# Patient Record
Sex: Female | Born: 1940
Health system: Southern US, Community
[De-identification: ages and names within clinical notes are randomized; demographics above are authoritative.]

## PROBLEM LIST (undated history)

## (undated) DIAGNOSIS — K5790 Diverticulosis of intestine, part unspecified, without perforation or abscess without bleeding: Secondary | ICD-10-CM

## (undated) DIAGNOSIS — R7303 Prediabetes: Secondary | ICD-10-CM

## (undated) DIAGNOSIS — E7800 Pure hypercholesterolemia, unspecified: Secondary | ICD-10-CM

## (undated) DIAGNOSIS — K5792 Diverticulitis of intestine, part unspecified, without perforation or abscess without bleeding: Secondary | ICD-10-CM

## (undated) HISTORY — DX: Diverticulosis of intestine, part unspecified, without perforation or abscess without bleeding: K57.90

## (undated) HISTORY — DX: Prediabetes: R73.03

## (undated) HISTORY — PX: CHOLECYSTECTOMY: SHX55

## (undated) HISTORY — PX: EYE SURGERY: SHX253

## (undated) HISTORY — DX: Pure hypercholesterolemia, unspecified: E78.00

## (undated) HISTORY — PX: OTHER SURGICAL HISTORY: SHX169

---

## 1998-08-20 ENCOUNTER — Encounter: Payer: Self-pay | Admitting: Emergency Medicine

## 1998-08-20 ENCOUNTER — Emergency Department (HOSPITAL_COMMUNITY): Admission: EM | Admit: 1998-08-20 | Discharge: 1998-08-20 | Payer: Self-pay | Admitting: Emergency Medicine

## 1999-06-30 ENCOUNTER — Other Ambulatory Visit: Admission: RE | Admit: 1999-06-30 | Discharge: 1999-06-30 | Payer: Self-pay | Admitting: Obstetrics & Gynecology

## 2000-05-16 ENCOUNTER — Encounter: Payer: Self-pay | Admitting: Internal Medicine

## 2000-05-16 ENCOUNTER — Ambulatory Visit (HOSPITAL_COMMUNITY): Admission: RE | Admit: 2000-05-16 | Discharge: 2000-05-16 | Payer: Self-pay | Admitting: Internal Medicine

## 2001-04-15 ENCOUNTER — Ambulatory Visit (HOSPITAL_COMMUNITY): Admission: RE | Admit: 2001-04-15 | Discharge: 2001-04-15 | Payer: Self-pay | Admitting: Internal Medicine

## 2001-04-15 ENCOUNTER — Encounter: Payer: Self-pay | Admitting: Internal Medicine

## 2002-06-15 ENCOUNTER — Ambulatory Visit (HOSPITAL_COMMUNITY): Admission: RE | Admit: 2002-06-15 | Discharge: 2002-06-15 | Payer: Self-pay | Admitting: Internal Medicine

## 2002-06-15 ENCOUNTER — Encounter: Payer: Self-pay | Admitting: Internal Medicine

## 2002-07-11 ENCOUNTER — Encounter: Payer: Self-pay | Admitting: Internal Medicine

## 2002-07-11 ENCOUNTER — Ambulatory Visit (HOSPITAL_COMMUNITY): Admission: RE | Admit: 2002-07-11 | Discharge: 2002-07-11 | Payer: Self-pay | Admitting: Internal Medicine

## 2003-09-04 ENCOUNTER — Ambulatory Visit (HOSPITAL_COMMUNITY): Admission: RE | Admit: 2003-09-04 | Discharge: 2003-09-04 | Payer: Self-pay | Admitting: Internal Medicine

## 2003-10-02 ENCOUNTER — Ambulatory Visit (HOSPITAL_COMMUNITY): Admission: RE | Admit: 2003-10-02 | Discharge: 2003-10-02 | Payer: Self-pay | Admitting: Internal Medicine

## 2003-10-08 ENCOUNTER — Ambulatory Visit (HOSPITAL_COMMUNITY): Admission: RE | Admit: 2003-10-08 | Discharge: 2003-10-08 | Payer: Self-pay | Admitting: General Surgery

## 2004-03-05 ENCOUNTER — Ambulatory Visit (HOSPITAL_COMMUNITY): Admission: RE | Admit: 2004-03-05 | Discharge: 2004-03-05 | Payer: Self-pay | Admitting: Internal Medicine

## 2004-09-25 ENCOUNTER — Ambulatory Visit (HOSPITAL_COMMUNITY): Admission: RE | Admit: 2004-09-25 | Discharge: 2004-09-25 | Payer: Self-pay | Admitting: Internal Medicine

## 2004-09-29 ENCOUNTER — Ambulatory Visit (HOSPITAL_COMMUNITY): Admission: RE | Admit: 2004-09-29 | Discharge: 2004-09-29 | Payer: Self-pay | Admitting: Internal Medicine

## 2005-02-02 ENCOUNTER — Ambulatory Visit (HOSPITAL_COMMUNITY): Admission: RE | Admit: 2005-02-02 | Discharge: 2005-02-02 | Payer: Self-pay | Admitting: Internal Medicine

## 2005-02-11 ENCOUNTER — Ambulatory Visit (HOSPITAL_COMMUNITY): Admission: RE | Admit: 2005-02-11 | Discharge: 2005-02-11 | Payer: Self-pay | Admitting: Internal Medicine

## 2006-03-15 ENCOUNTER — Ambulatory Visit: Payer: Self-pay | Admitting: Internal Medicine

## 2006-04-28 ENCOUNTER — Ambulatory Visit: Payer: Self-pay | Admitting: Internal Medicine

## 2006-05-25 ENCOUNTER — Ambulatory Visit (HOSPITAL_COMMUNITY): Admission: RE | Admit: 2006-05-25 | Discharge: 2006-05-25 | Payer: Self-pay | Admitting: Internal Medicine

## 2006-11-18 ENCOUNTER — Ambulatory Visit (HOSPITAL_COMMUNITY): Admission: RE | Admit: 2006-11-18 | Discharge: 2006-11-18 | Payer: Self-pay | Admitting: Internal Medicine

## 2007-11-24 ENCOUNTER — Ambulatory Visit (HOSPITAL_COMMUNITY): Admission: RE | Admit: 2007-11-24 | Discharge: 2007-11-24 | Payer: Self-pay | Admitting: Internal Medicine

## 2009-06-06 ENCOUNTER — Ambulatory Visit (HOSPITAL_COMMUNITY): Admission: RE | Admit: 2009-06-06 | Discharge: 2009-06-06 | Payer: Self-pay | Admitting: Internal Medicine

## 2010-02-09 ENCOUNTER — Encounter (INDEPENDENT_AMBULATORY_CARE_PROVIDER_SITE_OTHER): Payer: Self-pay | Admitting: Internal Medicine

## 2010-04-02 ENCOUNTER — Encounter (INDEPENDENT_AMBULATORY_CARE_PROVIDER_SITE_OTHER): Payer: Self-pay | Admitting: Internal Medicine

## 2010-04-02 ENCOUNTER — Ambulatory Visit (HOSPITAL_COMMUNITY): Admission: RE | Admit: 2010-04-02 | Payer: Self-pay | Source: Ambulatory Visit | Admitting: Internal Medicine

## 2010-05-22 ENCOUNTER — Encounter (HOSPITAL_BASED_OUTPATIENT_CLINIC_OR_DEPARTMENT_OTHER): Payer: Medicare Other | Admitting: Internal Medicine

## 2010-05-22 ENCOUNTER — Ambulatory Visit (HOSPITAL_COMMUNITY)
Admission: RE | Admit: 2010-05-22 | Discharge: 2010-05-22 | Disposition: A | Discharge status: Home / Self Care | Payer: Medicare Other | Source: Ambulatory Visit | Attending: Internal Medicine | Admitting: Internal Medicine

## 2010-05-22 DIAGNOSIS — Z800 Family history of malignant neoplasm of digestive organs: Secondary | ICD-10-CM

## 2010-05-22 DIAGNOSIS — K5730 Diverticulosis of large intestine without perforation or abscess without bleeding: Secondary | ICD-10-CM | POA: Insufficient documentation

## 2010-05-22 DIAGNOSIS — Z860100 Personal history of colon polyps, unspecified: Secondary | ICD-10-CM | POA: Insufficient documentation

## 2010-05-22 DIAGNOSIS — Q2733 Arteriovenous malformation of digestive system vessel: Secondary | ICD-10-CM | POA: Insufficient documentation

## 2010-05-22 DIAGNOSIS — Z09 Encounter for follow-up examination after completed treatment for conditions other than malignant neoplasm: Secondary | ICD-10-CM | POA: Insufficient documentation

## 2010-06-06 NOTE — H&P (Signed)
NAME:  Amy Rojas, Amy Rojas                         ACCOUNT NO.:  [id number]   MEDICAL RECORD NO.:  [id number]                   PATIENT TYPE:  OUT   LOCATION:  RAD                                  FACILITY:  [facility]   PHYSICIAN:  Dalia Heading, M.D.               DATE OF BIRTH:  02-May-1940   DATE OF ADMISSION:  09/04/2003  DATE OF DISCHARGE:  09/04/2003                                HISTORY & PHYSICAL   CHIEF COMPLAINT:  1. Biliary colic.  2. Cholelithiasis.   HISTORY OF PRESENT ILLNESS:  The patient is a 70 year old white female who  is referred for evaluation and treatment of biliary colic secondary to  cholelithiasis.  She has been having intermittent episodes of right upper  quadrant abdominal pain with radiation to the right flank, nausea and  bloating for several months.  She does have fatty food intolerance.  The  symptoms have persisted.  No fever, chills or  jaundice have been noted.   PAST MEDICAL HISTORY:  Hypertension.   PAST SURGICAL HISTORY:  Bilateral cataract surgery.   CURRENT MEDICATIONS:  1. Tricor one tablet p.o. daily.  2. Caltrate supplements.  3. Multivitamin supplement.  4. Fiber supplement.   ALLERGIES:  No known drug allergies.   REVIEW OF SYSTEMS:  Noncontributory.   PHYSICAL EXAMINATION:  GENERAL APPEARANCE:  The patient is a well-developed,  well-nourished, white female in no acute distress.  VITAL SIGNS:  She is afebrile and vital signs are stable.  HEENT:  No scleral icterus.  LUNGS:  Clear to auscultation with equal breath sounds bilaterally.  HEART:  Regular rate and rhythm without S3, S4 or murmurs.  ABDOMEN:  Soft with slight tenderness down the right upper quadrant to  palpation.  No hepatosplenomegaly, masses or hernias are identified.  Ultrasound of the gallbladder reveals cholelithiasis with a normal common  bile duct.   IMPRESSION:  Cholecystitis and cholelithiasis.   PLAN:  The patient is scheduled for laparoscopic cholecystectomy  on  October 05, 2003.  The risks and benefits of the procedure, including  bleeding, infection, hepatobiliary injury and the possibility of an open  procedure were fully explained to the patient, who gave informed consent.     ___________________________________________                                         Dalia Heading, M.D.   MAJ/MEDQ  D:  09/11/2003  T:  09/11/2003  Job:  [id number]   cc:   Kingsley Callander. Ouida Sills, M.D.  [address]  [place]  [place] [address]  Fax: [phone]

## 2010-06-06 NOTE — Op Note (Signed)
NAME:  Amy Rojas, Amy Rojas                         ACCOUNT NO.:  [id number]   MEDICAL RECORD NO.:  [id number]                   PATIENT TYPE:  AMB   LOCATION:  DAY                                  FACILITY:  [facility]   PHYSICIAN:  Lionel December, M.D.                 DATE OF BIRTH:  14-Nov-1940   DATE OF PROCEDURE:  10/02/2003  DATE OF DISCHARGE:                                 OPERATIVE REPORT   PROCEDURE:  Total colonoscopy.   INDICATIONS:  Amy Rojas is a 70 year old Caucasian female who is here for  screening exam. She does not have any GI symptoms. Family history is  significant for colon carcinoma in maternal uncle who died of it at age 63.  Two of his sons are the patient's first cousins also had colon carcinoma in  their 99s or early 45s.  She is therefore felt to be high risk and therefore  undergoing procedure at 5 year intervals rather than 10. Procedure and risks  were reviewed with the patient and informed consent was obtained.   PREOPERATIVE MEDICATIONS:  Demerol 25 mg IV, Versed 3 mg IV in divided  doses.   FINDINGS:  Procedure performed in endoscopy suite. The patient's vital signs  and O2 saturations were monitored during procedure and remained stable. The  patient was placed in the left lateral position and rectal examination  performed. No abnormality noted on external or digital exam. Olympus video  scope was placed in the rectum and advanced into sigmoid colon and beyond.  Preparation was satisfactory. She had a few scattered diverticula at the  sigmoid colon. Somewhat redundant hepatic flexure. Using different positions  and abdominal pressure, I was able to advance the scope into the cecum which  was identified by appendiceal orifice and ileocecal valve. Picture taken for  the record. As the scope was withdrawn, colonic mucosa was once again  carefully examined, and there were no mucosal abnormalities. The rectal  mucosa similarly was normal. Scope was retroflexed to examine  anorectal  junction, and small hemorrhoids were noted below the dentate line. The scope  was straightened and withdrawn. The patient tolerated the procedure well.   FINAL DIAGNOSIS:  Few small diverticula at sigmoid colon and external  hemorrhoids. Otherwise normal examination.   RECOMMENDATIONS:  1.  High-fiber diet.  2.  She will continue early Hemoccults and may consider next screening in      five years. If the next exam is also normal, she could increase interval      to every 10 years.      ___________________________________________                                            Lionel December, M.D.   NR/MEDQ  D:  10/02/2003  T:  10/02/2003  Job:  [id number]

## 2010-06-06 NOTE — Op Note (Signed)
NAME:  Amy Rojas, Amy Rojas                         ACCOUNT NO.:  [id number]   MEDICAL RECORD NO.:  [id number]                   PATIENT TYPE:  AMB   LOCATION:  DAY                                  FACILITY:  [facility]   PHYSICIAN:  Dalia Heading, M.D.               DATE OF BIRTH:  07-22-1940   DATE OF PROCEDURE:  10/08/2003  DATE OF DISCHARGE:                                 OPERATIVE REPORT   PREOPERATIVE DIAGNOSIS:  Cholecystitis, cholelithiasis.   POSTOPERATIVE DIAGNOSIS:  Cholecystitis, cholelithiasis.   PROCEDURE:  Laparoscopic cholecystectomy.   SURGEON:  Dalia Heading, M.D.   ASSISTANT:  Bernerd Limbo. Leona Carry, M.D.   ANESTHESIA:  General endotracheal.   INDICATIONS FOR PROCEDURE:  The patient is a 70 year old white female who  presents with cholecystitis secondary to cholelithiasis.  The risks and  benefits of the procedure, including bleeding, infection, hepatobiliary  injury, and the possibility of an open procedure were fully explained to the  patient who gave informed consent.   DESCRIPTION OF PROCEDURE:  The patient was placed in the supine position.  After induction of general endotracheal anesthesia, the abdomen was prepped  and draped using the usual sterile technique with Betadine.  Surgical site  confirmation was performed.   An infraumbilical incision was made down to the fascia.  A Veress needle was  introduced into the abdominal cavity, and confirmation of placement was done  using the saline drop test.  The abdomen was then insufflated to 16 mmHg  pressure.  An 11-mm trocar was introduced into the abdominal cavity under  direct visualization without difficulty.  The patient was placed in reverse  Trendelenburg position, and an additional 11-mm trocar was placed in the  epigastric region and 5-mm trocars were placed in the right upper quadrant  and right flank regions.  The liver was inspected and noted to be within  normal limits.  The gallbladder was retracted  superiorly and laterally.  The  dissection was begun around the infundibulum of the gallbladder.  The cystic  duct was first identified.  Its juncture to the infundibulum was fully  identified.  Endoclips were placed proximally and distally on the cystic  duct, and the cystic duct was divided.  This was likewise done on the cystic  artery.  The gallbladder was then freed away from the gallbladder fossa  using Bovie electrocautery.  The gallbladder was delivered through the  epigastric trocar site using an EndoCatch bag.  The gallbladder fossa was  inspected, and no abnormal bleeding or bile leakage was noted.  Surgicel was  placed in the gallbladder fossa.  All fluid and air were then evacuated from  the abdominal cavity prior to removal of the trocars.   All wounds were irrigated with normal saline.  All wounds were injected with  0.5% Sensorcaine.  The infraumbilical fascia was reapproximated using an 0  Vicryl interrupted suture.  All skin incisions  were closed using staples.  Betadine ointment and dry sterile dressings were applied.   All tape and needle counts were correct at the end of the procedure.  The  patient was extubated in the operating room and went back to the recovery  room awake and in stable condition.   COMPLICATIONS:  None.   SPECIMENS:  Gallbladder with stones.   ESTIMATED BLOOD LOSS:  Minimal.      MAJ/MEDQ  D:  10/08/2003  T:  10/08/2003  Job:  [id number]   cc:   Kingsley Callander. Ouida Sills, M.D.  [address]  [place]  [place] [address]  Fax: [phone]

## 2010-06-10 NOTE — Op Note (Signed)
  Amy Rojas, Amy Rojas               ACCOUNT NO.:  [id number]  MEDICAL RECORD NO.:  [id number]           PATIENT TYPE:  O  LOCATION:  DAYP                          FACILITY:  [facility]  PHYSICIAN:  Lionel December, M.D.    DATE OF BIRTH:  August 22, 1940  DATE OF PROCEDURE:  05/22/2010 DATE OF DISCHARGE:                              OPERATIVE REPORT   PROCEDURE:  Colonoscopy.  INDICATIONS:  [name] is a 70 year old Caucasian female with history of colonic polyps as well as positive family history of colon carcinoma. She lost her maternal uncle at age 10 of colon carcinoma and 2 first cousins of colon carcinoma in their 25s.  Her last exam in September 2005, however, was negative for colonic polyps.  She had polyps on prior exam.  Procedure risks were reviewed with the patient and informed consent was obtained.  MEDS FOR CONSCIOUS SEDATION: 1. Demerol 50 mg IV. 2. Versed 4 mg IV.  FINDINGS:  Procedure performed in endoscopy suite.  The patient's vital signs and O2 sat were monitored during the procedure and remained stable.  The patient was placed in left lateral recumbent position and rectal examination was performed.  No abnormality noted on external or digital exam.  Pentax videoscope was placed through rectum and advanced under vision into sigmoid colon where she had pieces of formed stool. Multiple diverticula were noted at sigmoid colon.  Some were quite large.  Scope was carefully passed through this area into descending colon and beyond.  Preparation above was satisfactory.  Scope was passed to cecum which was identified by appendiceal orifice and ileocecal valve.  There was a single AV malformation over the ileocecal valve without stigmata of bleeding.  Therefore this was left alone.  As the scope was withdrawn, rest of the colonic mucosa was carefully examined and no polyps and/or tumor masses were noted.  Rectal mucosa similarly was normal.  Scope was retroflexed to examine  anorectal junction and small hemorrhoids noted below the dentate line.  Endoscope was withdrawn.  Withdrawal time was 8 minutes.  The patient tolerated the procedure well.  FINAL DIAGNOSES: 1. Examination performed to cecum. 2. Single arteriovenous malformation over ileocecal valve without     stigmata of bleeding, was left alone. 3. Multiple diverticula at the sigmoid colon and small external     hemorrhoids.  RECOMMENDATIONS: 1. She will continue with high-fiber diet and her usual meds including     Colace p.r.n. 2. Should consider next exam in 5 years.          ______________________________ Lionel December, M.D.     NR/MEDQ  D:  05/22/2010  T:  05/22/2010  Job:  [id number]  cc:   Kingsley Callander. Ouida Sills, MD Fax: [phone]  Electronically Signed by Lionel December M.D. on 06/10/2010 12:19:18 PM

## 2010-10-01 ENCOUNTER — Other Ambulatory Visit (HOSPITAL_COMMUNITY): Payer: Self-pay | Admitting: Internal Medicine

## 2010-10-01 ENCOUNTER — Ambulatory Visit (HOSPITAL_COMMUNITY)
Admission: RE | Admit: 2010-10-01 | Discharge: 2010-10-01 | Disposition: A | Discharge status: Home / Self Care | Payer: Medicare Other | Source: Ambulatory Visit | Attending: Internal Medicine | Admitting: Internal Medicine

## 2010-10-01 DIAGNOSIS — M546 Pain in thoracic spine: Secondary | ICD-10-CM

## 2010-10-01 DIAGNOSIS — IMO0002 Reserved for concepts with insufficient information to code with codable children: Secondary | ICD-10-CM | POA: Insufficient documentation

## 2011-05-19 ENCOUNTER — Other Ambulatory Visit (HOSPITAL_COMMUNITY): Payer: Self-pay | Admitting: Internal Medicine

## 2011-05-19 DIAGNOSIS — Z139 Encounter for screening, unspecified: Secondary | ICD-10-CM

## 2011-05-26 ENCOUNTER — Ambulatory Visit (HOSPITAL_COMMUNITY)
Admission: RE | Admit: 2011-05-26 | Discharge: 2011-05-26 | Disposition: A | Discharge status: Home / Self Care | Payer: Medicare Other | Source: Ambulatory Visit | Attending: Internal Medicine | Admitting: Internal Medicine

## 2011-05-26 DIAGNOSIS — Z1231 Encounter for screening mammogram for malignant neoplasm of breast: Secondary | ICD-10-CM | POA: Insufficient documentation

## 2011-05-26 DIAGNOSIS — Z139 Encounter for screening, unspecified: Secondary | ICD-10-CM

## 2011-12-22 ENCOUNTER — Ambulatory Visit (HOSPITAL_COMMUNITY)
Admission: RE | Admit: 2011-12-22 | Discharge: 2011-12-22 | Disposition: A | Discharge status: Home / Self Care | Payer: Medicare Other | Source: Ambulatory Visit | Attending: Internal Medicine | Admitting: Internal Medicine

## 2011-12-22 ENCOUNTER — Other Ambulatory Visit (HOSPITAL_COMMUNITY): Payer: Self-pay | Admitting: Internal Medicine

## 2011-12-22 DIAGNOSIS — T148XXA Other injury of unspecified body region, initial encounter: Secondary | ICD-10-CM

## 2011-12-22 DIAGNOSIS — S6990XA Unspecified injury of unspecified wrist, hand and finger(s), initial encounter: Secondary | ICD-10-CM | POA: Insufficient documentation

## 2011-12-22 DIAGNOSIS — S6980XA Other specified injuries of unspecified wrist, hand and finger(s), initial encounter: Secondary | ICD-10-CM | POA: Insufficient documentation

## 2011-12-22 DIAGNOSIS — X58XXXA Exposure to other specified factors, initial encounter: Secondary | ICD-10-CM | POA: Insufficient documentation

## 2011-12-24 ENCOUNTER — Ambulatory Visit (INDEPENDENT_AMBULATORY_CARE_PROVIDER_SITE_OTHER): Payer: Medicare Other | Admitting: Orthopedic Surgery

## 2011-12-24 ENCOUNTER — Encounter: Payer: Self-pay | Admitting: Orthopedic Surgery

## 2011-12-24 DIAGNOSIS — M20011 Mallet finger of right finger(s): Secondary | ICD-10-CM

## 2011-12-24 DIAGNOSIS — M20019 Mallet finger of unspecified finger(s): Secondary | ICD-10-CM

## 2011-12-24 NOTE — Progress Notes (Signed)
Patient ID: Amy Rojas, female   DOB: 17-Mar-1940, 70 y.o.   MRN: [id number] Chief Complaint  Patient presents with  . Hand Injury    Small finger of right hand, DOI 12-18-11.    The patient is a pain over the right small finger after hitting it against a shelf putting something back at [facility] home improvement. ER results showed negative x-ray.  Systems she complains of double vision and watering of her eyes  Exam normal appearance, normal orientation. She has a normal mood and affect. On inspection she is a flexion deformity of the right small finger with normal passive motion at that joint it is otherwise stable with weak extension skin is intact good color normal sensation  X-ray negative  Impression soft tissue mallet  Recommend hand and rehabilitation extension protocol follow up in 6 weeks

## 2011-12-24 NOTE — Patient Instructions (Signed)
REFERRAL TO [facility] FOR MALLET FINGER PROTOCOL

## 2012-02-03 ENCOUNTER — Telehealth: Payer: Self-pay | Admitting: Orthopedic Surgery

## 2012-02-03 NOTE — Telephone Encounter (Signed)
i called patient and relayed per dr Romeo Apple to keep appointment tomorrow; she states she cannot come - said she is very busy right now.   encouraged her to keep appointment and offered alternate time.  She would not re-schedule; states she will have to call back tomorrow to re-schedule.

## 2012-02-03 NOTE — Telephone Encounter (Signed)
Keep appointment here

## 2012-02-03 NOTE — Telephone Encounter (Signed)
Patient called back following our call to confirm her appointment for tomorrow, fol/up for mallet finger.  Patient states she never received a call from [facility], [place], to schedule for splint and therapy, although she had not called to re-check with our office..  I personally was not here that day but I explained to patient that typically we give paperwork to patient with the contact phone # of therapy to call to schedule the appointment according to best possible time for patient - and that we fax the order to therapy provider. (I called this office to verify if they have an order - reached voice mail; left message)  Patient states she feels she should cancel the appointment due to no therapy yet.  She also said she made a splint of her own out of popsicle sticks, and if still needs to go to therapy, would prefer to not schedule with [facility].  Please advise - keep appointment for tomorrow, 1/16 to re-evaluate and to make arrangements for therapy if still required?  PT ph [phone]

## 2012-02-04 ENCOUNTER — Ambulatory Visit: Payer: Medicare Other | Admitting: Orthopedic Surgery

## 2012-02-18 ENCOUNTER — Ambulatory Visit: Payer: Medicare Other | Admitting: Orthopedic Surgery

## 2012-03-01 ENCOUNTER — Encounter: Payer: Self-pay | Admitting: Orthopedic Surgery

## 2012-03-01 ENCOUNTER — Ambulatory Visit (INDEPENDENT_AMBULATORY_CARE_PROVIDER_SITE_OTHER): Payer: Medicare Other | Admitting: Orthopedic Surgery

## 2012-03-01 VITALS — BP 122/62 | Ht 64.5 in | Wt 131.0 lb

## 2012-03-01 DIAGNOSIS — M20011 Mallet finger of right finger(s): Secondary | ICD-10-CM

## 2012-03-01 DIAGNOSIS — M20019 Mallet finger of unspecified finger(s): Secondary | ICD-10-CM

## 2012-03-01 NOTE — Patient Instructions (Signed)
activities as tolerated 

## 2012-03-01 NOTE — Progress Notes (Signed)
Patient ID: Amy Rojas, female   DOB: 1940/02/06, 72 y.o.   MRN: [id number] Chief Complaint  Patient presents with  . Follow-up    6 week recheck on right small mallet finger, did not go to therapy.    RSF DIP joint Mallet finger   Residual flexion deformity  Can make the full fist   Ok with residual deficit with extension   Follow up prn

## 2012-03-24 ENCOUNTER — Other Ambulatory Visit (HOSPITAL_COMMUNITY): Payer: Self-pay | Admitting: Internal Medicine

## 2012-03-24 ENCOUNTER — Ambulatory Visit (HOSPITAL_COMMUNITY)
Admission: RE | Admit: 2012-03-24 | Discharge: 2012-03-24 | Disposition: A | Discharge status: Home / Self Care | Payer: Medicare Other | Source: Ambulatory Visit | Attending: Internal Medicine | Admitting: Internal Medicine

## 2012-03-24 DIAGNOSIS — Z139 Encounter for screening, unspecified: Secondary | ICD-10-CM

## 2012-03-24 DIAGNOSIS — R079 Chest pain, unspecified: Secondary | ICD-10-CM

## 2012-03-24 DIAGNOSIS — R0789 Other chest pain: Secondary | ICD-10-CM | POA: Insufficient documentation

## 2013-05-12 DIAGNOSIS — E785 Hyperlipidemia, unspecified: Secondary | ICD-10-CM | POA: Insufficient documentation

## 2013-05-12 DIAGNOSIS — E119 Type 2 diabetes mellitus without complications: Secondary | ICD-10-CM | POA: Insufficient documentation

## 2013-05-12 DIAGNOSIS — R809 Proteinuria, unspecified: Secondary | ICD-10-CM | POA: Insufficient documentation

## 2013-05-12 DIAGNOSIS — N19 Unspecified kidney failure: Secondary | ICD-10-CM | POA: Insufficient documentation

## 2013-05-15 ENCOUNTER — Encounter: Payer: Self-pay | Admitting: Cardiology

## 2013-05-15 ENCOUNTER — Ambulatory Visit (INDEPENDENT_AMBULATORY_CARE_PROVIDER_SITE_OTHER): Payer: Medicare Other | Admitting: Cardiology

## 2013-05-15 VITALS — BP 130/88 | HR 64 | Ht 66.0 in | Wt 134.0 lb

## 2013-05-15 DIAGNOSIS — R079 Chest pain, unspecified: Secondary | ICD-10-CM

## 2013-05-15 DIAGNOSIS — I447 Left bundle-branch block, unspecified: Secondary | ICD-10-CM

## 2013-05-15 NOTE — Patient Instructions (Signed)
Your physician recommends that you schedule a follow-up appointment to be determined after the echo.We will call you with results  Your physician has requested that you have an echocardiogram. Echocardiography is a painless test that uses sound waves to create images of your heart. It provides your doctor with information about the size and shape of your heart and how well your heart's chambers and valves are working. This procedure takes approximately one hour. There are no restrictions for this procedure.     Thank you for choosing [facility] !

## 2013-05-15 NOTE — Progress Notes (Signed)
Clinical Summary Amy Rojas is a 73 y.o.female seen today as a new patient, she is referred for chest pain  1. CAD - walks regularly, states that when walks up incline feeling of pressure in midchest 3/10, with SOB, not diaphoretic, no nausea. Resolves once get back on level ground. Symptoms started 2 months ago. Can vary in frequnecy, stable in level of exertion of onset. - No LE edema, no orthopnea, no PND - walks one mile, typically over 20 minutes  - outpatient EKG by her PCP shows LBBB, unknown duration - CAD risk factors: DM, HL,hypertriglyceridemia, age. Brother CABG 73s.   Past Medical History  Diagnosis Date  . High cholesterol   . Diverticulosis   . Borderline diabetic      Allergies  Allergen Reactions  . Sulfa Antibiotics      Current Outpatient Prescriptions  Medication Sig Dispense Refill  . aspirin 81 MG tablet Take 81 mg by mouth daily.      Mariane Baumgarten Sodium (COLACE PO) Take by mouth.      . fenofibrate 160 MG tablet Take 160 mg by mouth daily.       . nitrofurantoin, macrocrystal-monohydrate, (MACROBID) 100 MG capsule Take 100 mg by mouth 2 (two) times daily.      . Psyllium (METAMUCIL PO) Take by mouth.       No current facility-administered medications for this visit.     Past Surgical History  Procedure Laterality Date  . High cholesterol    . Cataracts    . Cholecystectomy    . Eye surgery       Allergies  Allergen Reactions  . Sulfa Antibiotics       Family History  Problem Relation Age of Onset  . Arthritis    . Diabetes    . Kidney disease       Social History Ms. Cullars reports that she has never smoked. She does not have any smokeless tobacco history on file. Ms. Mcdonald reports that she does not drink alcohol.   Review of Systems CONSTITUTIONAL: No weight loss, fever, chills, weakness or fatigue.  HEENT: Eyes: No visual loss, blurred vision, double vision or yellow sclerae.No hearing loss, sneezing,  congestion, runny nose or sore throat.  SKIN: No rash or itching.  CARDIOVASCULAR: per HPI RESPIRATORY: No shortness of breath, cough or sputum.  GASTROINTESTINAL: No anorexia, nausea, vomiting or diarrhea. No abdominal pain or blood.  GENITOURINARY: No burning on urination, no polyuria NEUROLOGICAL: No headache, dizziness, syncope, paralysis, ataxia, numbness or tingling in the extremities. No change in bowel or bladder control.  MUSCULOSKELETAL: No muscle, back pain, joint pain or stiffness.  LYMPHATICS: No enlarged nodes. No history of splenectomy.  PSYCHIATRIC: No history of depression or anxiety.  ENDOCRINOLOGIC: No reports of sweating, cold or heat intolerance. No polyuria or polydipsia.  Marland Kitchen   Physical Examination p 64 bp 130/88 Wt 134 lbs BMI 22 Gen: resting comfortably, no acute distress HEENT: no scleral icterus, pupils equal round and reactive, no palptable cervical adenopathy,  CV: RRR, no m/r/g, no JVD, no carotid bruits Resp: Clear to auscultation bilaterally GI: abdomen is soft, non-tender, non-distended, normal bowel sounds, no hepatosplenomegaly MSK: extremities are warm, no edema.  Skin: warm, no rash Neuro:  no focal deficits Psych: appropriate affect   EKG NSR, LBBB  Assessment and Plan  1. Chest pain - symptoms suggestive of possible exertional angina - she has several CAD risk factors for CAD and a LBBB which may  support underlying structural heart disease - at this point will obtain an echo to evaluate LV systolic function. If normal LVEF proceed with Lexiscan, if decreased LVEF in the setting of LBBB and exertional chest pain would pursue invasive testing.      Arnoldo Lenis, M.D., F.A.C.C.

## 2013-05-16 ENCOUNTER — Other Ambulatory Visit: Payer: Self-pay

## 2013-05-16 ENCOUNTER — Ambulatory Visit (HOSPITAL_COMMUNITY)
Admission: RE | Admit: 2013-05-16 | Discharge: 2013-05-16 | Disposition: A | Discharge status: Home / Self Care | Payer: Medicare Other | Source: Ambulatory Visit | Attending: Cardiology | Admitting: Cardiology

## 2013-05-16 ENCOUNTER — Encounter: Payer: Self-pay | Admitting: Cardiology

## 2013-05-16 DIAGNOSIS — R079 Chest pain, unspecified: Secondary | ICD-10-CM

## 2013-05-16 DIAGNOSIS — E785 Hyperlipidemia, unspecified: Secondary | ICD-10-CM | POA: Insufficient documentation

## 2013-05-16 DIAGNOSIS — I2510 Atherosclerotic heart disease of native coronary artery without angina pectoris: Secondary | ICD-10-CM | POA: Insufficient documentation

## 2013-05-16 DIAGNOSIS — I517 Cardiomegaly: Secondary | ICD-10-CM

## 2013-05-16 DIAGNOSIS — E119 Type 2 diabetes mellitus without complications: Secondary | ICD-10-CM | POA: Insufficient documentation

## 2013-05-16 NOTE — Progress Notes (Signed)
  Echocardiogram 2D Echocardiogram has been performed.  Miyuki Rzasa L Levada Bowersox 05/16/2013, 10:01 AM

## 2013-05-18 ENCOUNTER — Telehealth: Payer: Self-pay | Admitting: Cardiology

## 2013-05-18 ENCOUNTER — Encounter: Payer: Self-pay | Admitting: Cardiology

## 2013-05-18 ENCOUNTER — Encounter: Payer: Self-pay | Admitting: *Deleted

## 2013-05-18 NOTE — Telephone Encounter (Signed)
Patient would like to speak with nurse regarding questions about lexiscan myoview / tgs

## 2013-05-18 NOTE — Telephone Encounter (Signed)
Made pt aware about Lexiscan , also sendng information and instructions in the mail.

## 2013-05-26 ENCOUNTER — Encounter (HOSPITAL_COMMUNITY)
Admission: RE | Admit: 2013-05-26 | Discharge: 2013-05-26 | Disposition: A | Discharge status: Home / Self Care | Payer: Medicare Other | Source: Ambulatory Visit | Attending: Cardiology | Admitting: Cardiology

## 2013-05-26 ENCOUNTER — Encounter (HOSPITAL_COMMUNITY): Payer: Self-pay

## 2013-05-26 DIAGNOSIS — R079 Chest pain, unspecified: Secondary | ICD-10-CM | POA: Insufficient documentation

## 2013-05-26 DIAGNOSIS — I447 Left bundle-branch block, unspecified: Secondary | ICD-10-CM | POA: Insufficient documentation

## 2013-05-26 DIAGNOSIS — E785 Hyperlipidemia, unspecified: Secondary | ICD-10-CM | POA: Insufficient documentation

## 2013-05-26 MED ORDER — TECHNETIUM TC 99M SESTAMIBI GENERIC - CARDIOLITE
30.0000 | Freq: Once | INTRAVENOUS | Status: AC | PRN
  Administered 2013-05-26: 30 via INTRAVENOUS

## 2013-05-26 MED ORDER — TECHNETIUM TC 99M SESTAMIBI - CARDIOLITE
10.0000 | Freq: Once | INTRAVENOUS | Status: AC | PRN
  Administered 2013-05-26: 09:00:00 10 via INTRAVENOUS

## 2013-05-26 MED ORDER — REGADENOSON 0.4 MG/5ML IV SOLN
INTRAVENOUS | Status: AC
  Administered 2013-05-26: 0.4 mg via INTRAVENOUS
  Filled 2013-05-26: qty 5

## 2013-05-26 MED ORDER — SODIUM CHLORIDE 0.9 % IJ SOLN
INTRAMUSCULAR | Status: AC
  Administered 2013-05-26: 10 mL via INTRAVENOUS
  Filled 2013-05-26: qty 10

## 2013-05-26 NOTE — Progress Notes (Signed)
St[facility] Lab Nurses Notes - Forestine Na  CHAUNTAY PASZKIEWICZ 05/26/2013 Reason for doing test: Chest Pain and LBBB Type of test: Wille Glaser Nurse performing test: Gerrit Halls, RN Nuclear Medicine Tech: Melburn Hake Echo Tech: Not Applicable MD performing test: Myles Gip Family MD: Willey Blade Test explained and consent signed: yes IV started: 22g jelco, Saline lock flushed, No redness or edema and Saline lock started in radiology Symptoms: chest full & pressure Treatment/Intervention: None Reason test stopped: protocol completed After recovery IV was: Discontinued via X-ray tech and No redness or edema Patient to return to [place]. Med at : 12:00 Patient discharged: Home Patient's Condition upon discharge was: stable Comments: During test BP 158/60 & HR 100.  Recovery BP 151/55 & HR 82.  Symptoms resolved in recovery. Donnajean Lopes

## 2013-10-24 ENCOUNTER — Other Ambulatory Visit (HOSPITAL_COMMUNITY): Payer: Self-pay | Admitting: Internal Medicine

## 2013-10-24 DIAGNOSIS — Z139 Encounter for screening, unspecified: Secondary | ICD-10-CM

## 2013-10-26 ENCOUNTER — Ambulatory Visit (HOSPITAL_COMMUNITY)
Admission: RE | Admit: 2013-10-26 | Discharge: 2013-10-26 | Disposition: A | Discharge status: Home / Self Care | Payer: Medicare Other | Source: Ambulatory Visit | Attending: Internal Medicine | Admitting: Internal Medicine

## 2013-10-26 DIAGNOSIS — Z1231 Encounter for screening mammogram for malignant neoplasm of breast: Secondary | ICD-10-CM | POA: Insufficient documentation

## 2013-10-26 DIAGNOSIS — Z139 Encounter for screening, unspecified: Secondary | ICD-10-CM

## 2014-01-22 DIAGNOSIS — S63501S Unspecified sprain of right wrist, sequela: Secondary | ICD-10-CM | POA: Diagnosis not present

## 2014-01-22 DIAGNOSIS — Z23 Encounter for immunization: Secondary | ICD-10-CM | POA: Diagnosis not present

## 2014-01-29 DIAGNOSIS — E119 Type 2 diabetes mellitus without complications: Secondary | ICD-10-CM | POA: Diagnosis not present

## 2014-02-23 DIAGNOSIS — E119 Type 2 diabetes mellitus without complications: Secondary | ICD-10-CM | POA: Diagnosis not present

## 2014-02-23 DIAGNOSIS — Z79899 Other long term (current) drug therapy: Secondary | ICD-10-CM | POA: Diagnosis not present

## 2014-02-23 DIAGNOSIS — E785 Hyperlipidemia, unspecified: Secondary | ICD-10-CM | POA: Diagnosis not present

## 2014-02-28 DIAGNOSIS — H40003 Preglaucoma, unspecified, bilateral: Secondary | ICD-10-CM | POA: Diagnosis not present

## 2014-03-01 DIAGNOSIS — E785 Hyperlipidemia, unspecified: Secondary | ICD-10-CM | POA: Diagnosis not present

## 2014-03-01 DIAGNOSIS — E119 Type 2 diabetes mellitus without complications: Secondary | ICD-10-CM | POA: Diagnosis not present

## 2014-03-01 DIAGNOSIS — H9041 Sensorineural hearing loss, unilateral, right ear, with unrestricted hearing on the contralateral side: Secondary | ICD-10-CM | POA: Diagnosis not present

## 2014-03-16 DIAGNOSIS — J069 Acute upper respiratory infection, unspecified: Secondary | ICD-10-CM | POA: Diagnosis not present

## 2014-03-27 DIAGNOSIS — H40003 Preglaucoma, unspecified, bilateral: Secondary | ICD-10-CM | POA: Diagnosis not present

## 2014-04-23 DIAGNOSIS — H903 Sensorineural hearing loss, bilateral: Secondary | ICD-10-CM | POA: Diagnosis not present

## 2014-06-26 DIAGNOSIS — E119 Type 2 diabetes mellitus without complications: Secondary | ICD-10-CM | POA: Diagnosis not present

## 2014-07-03 DIAGNOSIS — E1129 Type 2 diabetes mellitus with other diabetic kidney complication: Secondary | ICD-10-CM | POA: Diagnosis not present

## 2014-09-21 DIAGNOSIS — N3000 Acute cystitis without hematuria: Secondary | ICD-10-CM | POA: Diagnosis not present

## 2014-09-21 DIAGNOSIS — Z6820 Body mass index (BMI) 20.0-20.9, adult: Secondary | ICD-10-CM | POA: Diagnosis not present

## 2014-09-21 DIAGNOSIS — N390 Urinary tract infection, site not specified: Secondary | ICD-10-CM | POA: Diagnosis not present

## 2014-09-27 DIAGNOSIS — H40003 Preglaucoma, unspecified, bilateral: Secondary | ICD-10-CM | POA: Diagnosis not present

## 2014-10-30 DIAGNOSIS — Z79899 Other long term (current) drug therapy: Secondary | ICD-10-CM | POA: Diagnosis not present

## 2014-10-30 DIAGNOSIS — E785 Hyperlipidemia, unspecified: Secondary | ICD-10-CM | POA: Diagnosis not present

## 2014-10-30 DIAGNOSIS — E119 Type 2 diabetes mellitus without complications: Secondary | ICD-10-CM | POA: Diagnosis not present

## 2014-11-05 DIAGNOSIS — E119 Type 2 diabetes mellitus without complications: Secondary | ICD-10-CM | POA: Diagnosis not present

## 2014-11-05 DIAGNOSIS — Z6820 Body mass index (BMI) 20.0-20.9, adult: Secondary | ICD-10-CM | POA: Diagnosis not present

## 2014-11-05 DIAGNOSIS — Z23 Encounter for immunization: Secondary | ICD-10-CM | POA: Diagnosis not present

## 2015-02-27 DIAGNOSIS — D239 Other benign neoplasm of skin, unspecified: Secondary | ICD-10-CM | POA: Diagnosis not present

## 2015-02-27 DIAGNOSIS — L570 Actinic keratosis: Secondary | ICD-10-CM | POA: Diagnosis not present

## 2015-03-06 DIAGNOSIS — E119 Type 2 diabetes mellitus without complications: Secondary | ICD-10-CM | POA: Diagnosis not present

## 2015-03-06 DIAGNOSIS — E785 Hyperlipidemia, unspecified: Secondary | ICD-10-CM | POA: Diagnosis not present

## 2015-03-06 DIAGNOSIS — Z79899 Other long term (current) drug therapy: Secondary | ICD-10-CM | POA: Diagnosis not present

## 2015-03-06 DIAGNOSIS — N19 Unspecified kidney failure: Secondary | ICD-10-CM | POA: Diagnosis not present

## 2015-03-12 DIAGNOSIS — E785 Hyperlipidemia, unspecified: Secondary | ICD-10-CM | POA: Diagnosis not present

## 2015-03-12 DIAGNOSIS — E119 Type 2 diabetes mellitus without complications: Secondary | ICD-10-CM | POA: Diagnosis not present

## 2015-03-12 DIAGNOSIS — Z6821 Body mass index (BMI) 21.0-21.9, adult: Secondary | ICD-10-CM | POA: Diagnosis not present

## 2015-03-12 DIAGNOSIS — Z23 Encounter for immunization: Secondary | ICD-10-CM | POA: Diagnosis not present

## 2015-03-27 DIAGNOSIS — H40003 Preglaucoma, unspecified, bilateral: Secondary | ICD-10-CM | POA: Diagnosis not present

## 2015-04-02 DIAGNOSIS — H40003 Preglaucoma, unspecified, bilateral: Secondary | ICD-10-CM | POA: Diagnosis not present

## 2015-06-04 ENCOUNTER — Encounter (INDEPENDENT_AMBULATORY_CARE_PROVIDER_SITE_OTHER): Payer: Self-pay | Admitting: *Deleted

## 2015-07-03 DIAGNOSIS — E119 Type 2 diabetes mellitus without complications: Secondary | ICD-10-CM | POA: Diagnosis not present

## 2015-07-11 ENCOUNTER — Other Ambulatory Visit (HOSPITAL_COMMUNITY): Payer: Self-pay | Admitting: Internal Medicine

## 2015-07-11 DIAGNOSIS — Z1231 Encounter for screening mammogram for malignant neoplasm of breast: Secondary | ICD-10-CM

## 2015-07-11 DIAGNOSIS — E119 Type 2 diabetes mellitus without complications: Secondary | ICD-10-CM | POA: Diagnosis not present

## 2015-07-15 ENCOUNTER — Ambulatory Visit (HOSPITAL_COMMUNITY)
Admission: RE | Admit: 2015-07-15 | Discharge: 2015-07-15 | Disposition: A | Discharge status: Home / Self Care | Payer: Medicare Other | Source: Ambulatory Visit | Attending: Internal Medicine | Admitting: Internal Medicine

## 2015-07-15 DIAGNOSIS — Z1231 Encounter for screening mammogram for malignant neoplasm of breast: Secondary | ICD-10-CM

## 2015-08-06 DIAGNOSIS — E119 Type 2 diabetes mellitus without complications: Secondary | ICD-10-CM | POA: Diagnosis not present

## 2015-08-29 DIAGNOSIS — H903 Sensorineural hearing loss, bilateral: Secondary | ICD-10-CM | POA: Diagnosis not present

## 2015-09-30 DIAGNOSIS — H40003 Preglaucoma, unspecified, bilateral: Secondary | ICD-10-CM | POA: Diagnosis not present

## 2015-10-28 DIAGNOSIS — S70362A Insect bite (nonvenomous), left thigh, initial encounter: Secondary | ICD-10-CM | POA: Diagnosis not present

## 2015-10-28 DIAGNOSIS — Z23 Encounter for immunization: Secondary | ICD-10-CM | POA: Diagnosis not present

## 2015-10-28 DIAGNOSIS — W57XXXA Bitten or stung by nonvenomous insect and other nonvenomous arthropods, initial encounter: Secondary | ICD-10-CM | POA: Diagnosis not present

## 2015-11-21 DIAGNOSIS — E119 Type 2 diabetes mellitus without complications: Secondary | ICD-10-CM | POA: Diagnosis not present

## 2015-11-26 DIAGNOSIS — E119 Type 2 diabetes mellitus without complications: Secondary | ICD-10-CM | POA: Diagnosis not present

## 2015-11-26 DIAGNOSIS — R202 Paresthesia of skin: Secondary | ICD-10-CM | POA: Diagnosis not present

## 2015-11-26 DIAGNOSIS — R208 Other disturbances of skin sensation: Secondary | ICD-10-CM | POA: Diagnosis not present

## 2015-12-10 DIAGNOSIS — H40003 Preglaucoma, unspecified, bilateral: Secondary | ICD-10-CM | POA: Diagnosis not present

## 2016-01-21 DIAGNOSIS — H401131 Primary open-angle glaucoma, bilateral, mild stage: Secondary | ICD-10-CM | POA: Diagnosis not present

## 2016-03-25 DIAGNOSIS — E119 Type 2 diabetes mellitus without complications: Secondary | ICD-10-CM | POA: Diagnosis not present

## 2016-03-25 DIAGNOSIS — R809 Proteinuria, unspecified: Secondary | ICD-10-CM | POA: Diagnosis not present

## 2016-03-25 DIAGNOSIS — Z79899 Other long term (current) drug therapy: Secondary | ICD-10-CM | POA: Diagnosis not present

## 2016-03-25 DIAGNOSIS — E785 Hyperlipidemia, unspecified: Secondary | ICD-10-CM | POA: Diagnosis not present

## 2016-03-31 DIAGNOSIS — E1122 Type 2 diabetes mellitus with diabetic chronic kidney disease: Secondary | ICD-10-CM | POA: Diagnosis not present

## 2016-03-31 DIAGNOSIS — Z6821 Body mass index (BMI) 21.0-21.9, adult: Secondary | ICD-10-CM | POA: Diagnosis not present

## 2016-03-31 DIAGNOSIS — N183 Chronic kidney disease, stage 3 (moderate): Secondary | ICD-10-CM | POA: Diagnosis not present

## 2016-03-31 DIAGNOSIS — E785 Hyperlipidemia, unspecified: Secondary | ICD-10-CM | POA: Diagnosis not present

## 2016-05-22 DIAGNOSIS — H401131 Primary open-angle glaucoma, bilateral, mild stage: Secondary | ICD-10-CM | POA: Diagnosis not present

## 2016-05-26 DIAGNOSIS — Z01419 Encounter for gynecological examination (general) (routine) without abnormal findings: Secondary | ICD-10-CM | POA: Diagnosis not present

## 2016-05-26 DIAGNOSIS — Z124 Encounter for screening for malignant neoplasm of cervix: Secondary | ICD-10-CM | POA: Diagnosis not present

## 2016-05-26 DIAGNOSIS — Z6821 Body mass index (BMI) 21.0-21.9, adult: Secondary | ICD-10-CM | POA: Diagnosis not present

## 2016-06-02 DIAGNOSIS — H401131 Primary open-angle glaucoma, bilateral, mild stage: Secondary | ICD-10-CM | POA: Diagnosis not present

## 2016-07-20 DIAGNOSIS — E785 Hyperlipidemia, unspecified: Secondary | ICD-10-CM | POA: Diagnosis not present

## 2016-07-20 DIAGNOSIS — E119 Type 2 diabetes mellitus without complications: Secondary | ICD-10-CM | POA: Diagnosis not present

## 2016-07-20 DIAGNOSIS — Z79899 Other long term (current) drug therapy: Secondary | ICD-10-CM | POA: Diagnosis not present

## 2016-07-27 DIAGNOSIS — E119 Type 2 diabetes mellitus without complications: Secondary | ICD-10-CM | POA: Diagnosis not present

## 2016-07-29 DIAGNOSIS — N8111 Cystocele, midline: Secondary | ICD-10-CM | POA: Insufficient documentation

## 2016-07-29 DIAGNOSIS — N952 Postmenopausal atrophic vaginitis: Secondary | ICD-10-CM | POA: Diagnosis not present

## 2016-07-29 DIAGNOSIS — N814 Uterovaginal prolapse, unspecified: Secondary | ICD-10-CM | POA: Insufficient documentation

## 2016-07-30 DIAGNOSIS — E1122 Type 2 diabetes mellitus with diabetic chronic kidney disease: Secondary | ICD-10-CM | POA: Diagnosis not present

## 2016-07-30 DIAGNOSIS — N183 Chronic kidney disease, stage 3 (moderate): Secondary | ICD-10-CM | POA: Diagnosis not present

## 2016-10-28 ENCOUNTER — Other Ambulatory Visit (HOSPITAL_COMMUNITY): Payer: Self-pay | Admitting: Internal Medicine

## 2016-10-28 DIAGNOSIS — Z1231 Encounter for screening mammogram for malignant neoplasm of breast: Secondary | ICD-10-CM

## 2016-10-29 ENCOUNTER — Ambulatory Visit (HOSPITAL_COMMUNITY)
Admission: RE | Admit: 2016-10-29 | Discharge: 2016-10-29 | Disposition: A | Discharge status: Home / Self Care | Payer: Medicare Other | Source: Ambulatory Visit | Attending: Internal Medicine | Admitting: Internal Medicine

## 2016-10-29 DIAGNOSIS — Z1231 Encounter for screening mammogram for malignant neoplasm of breast: Secondary | ICD-10-CM | POA: Diagnosis not present

## 2016-11-11 DIAGNOSIS — R222 Localized swelling, mass and lump, trunk: Secondary | ICD-10-CM | POA: Diagnosis not present

## 2016-11-11 DIAGNOSIS — Z23 Encounter for immunization: Secondary | ICD-10-CM | POA: Diagnosis not present

## 2016-11-12 ENCOUNTER — Ambulatory Visit (HOSPITAL_COMMUNITY)
Admission: RE | Admit: 2016-11-12 | Discharge: 2016-11-12 | Disposition: A | Discharge status: Home / Self Care | Payer: Medicare Other | Source: Ambulatory Visit | Attending: Internal Medicine | Admitting: Internal Medicine

## 2016-11-12 ENCOUNTER — Other Ambulatory Visit (HOSPITAL_COMMUNITY): Payer: Self-pay | Admitting: Internal Medicine

## 2016-11-12 DIAGNOSIS — R222 Localized swelling, mass and lump, trunk: Secondary | ICD-10-CM

## 2016-11-12 DIAGNOSIS — J449 Chronic obstructive pulmonary disease, unspecified: Secondary | ICD-10-CM | POA: Diagnosis not present

## 2016-11-23 ENCOUNTER — Other Ambulatory Visit: Payer: Self-pay

## 2016-11-23 NOTE — Patient Outreach (Signed)
[facility]) Care Management  11/23/2016  Amy Rojas 1940-12-10 [id number]   Medication Adherence call to [name] patient is showing past due under [facility].on Losartan 50 mg spoke to patient she explain that she is not taking this medication for blood pressure she is taking it for something else but she has been reading about side effects and she was a little concern she said she will go over with doctor Asencion Noble next week on her next appointment.   [facility] Management Direct Dial [phone]  Fax [phone] [email or web address]

## 2016-11-26 DIAGNOSIS — N183 Chronic kidney disease, stage 3 (moderate): Secondary | ICD-10-CM | POA: Diagnosis not present

## 2016-11-26 DIAGNOSIS — Z79899 Other long term (current) drug therapy: Secondary | ICD-10-CM | POA: Diagnosis not present

## 2016-11-26 DIAGNOSIS — E1129 Type 2 diabetes mellitus with other diabetic kidney complication: Secondary | ICD-10-CM | POA: Diagnosis not present

## 2016-12-17 DIAGNOSIS — H401131 Primary open-angle glaucoma, bilateral, mild stage: Secondary | ICD-10-CM | POA: Diagnosis not present

## 2016-12-30 DIAGNOSIS — E1129 Type 2 diabetes mellitus with other diabetic kidney complication: Secondary | ICD-10-CM | POA: Diagnosis not present

## 2016-12-30 DIAGNOSIS — K5792 Diverticulitis of intestine, part unspecified, without perforation or abscess without bleeding: Secondary | ICD-10-CM | POA: Diagnosis not present

## 2016-12-30 DIAGNOSIS — N183 Chronic kidney disease, stage 3 (moderate): Secondary | ICD-10-CM | POA: Diagnosis not present

## 2017-02-08 DIAGNOSIS — K5792 Diverticulitis of intestine, part unspecified, without perforation or abscess without bleeding: Secondary | ICD-10-CM | POA: Diagnosis not present

## 2017-04-06 DIAGNOSIS — J069 Acute upper respiratory infection, unspecified: Secondary | ICD-10-CM | POA: Diagnosis not present

## 2017-05-06 ENCOUNTER — Encounter: Payer: Self-pay | Admitting: Internal Medicine

## 2017-05-06 DIAGNOSIS — E1129 Type 2 diabetes mellitus with other diabetic kidney complication: Secondary | ICD-10-CM | POA: Diagnosis not present

## 2017-05-06 DIAGNOSIS — E785 Hyperlipidemia, unspecified: Secondary | ICD-10-CM | POA: Diagnosis not present

## 2017-05-06 DIAGNOSIS — Z79899 Other long term (current) drug therapy: Secondary | ICD-10-CM | POA: Diagnosis not present

## 2017-05-06 DIAGNOSIS — N183 Chronic kidney disease, stage 3 (moderate): Secondary | ICD-10-CM | POA: Diagnosis not present

## 2017-05-13 DIAGNOSIS — E785 Hyperlipidemia, unspecified: Secondary | ICD-10-CM | POA: Diagnosis not present

## 2017-05-13 DIAGNOSIS — E119 Type 2 diabetes mellitus without complications: Secondary | ICD-10-CM | POA: Diagnosis not present

## 2017-05-13 DIAGNOSIS — R809 Proteinuria, unspecified: Secondary | ICD-10-CM | POA: Diagnosis not present

## 2017-06-15 DIAGNOSIS — H401131 Primary open-angle glaucoma, bilateral, mild stage: Secondary | ICD-10-CM | POA: Diagnosis not present

## 2017-06-30 ENCOUNTER — Ambulatory Visit (INDEPENDENT_AMBULATORY_CARE_PROVIDER_SITE_OTHER): Payer: Medicare Other | Admitting: Internal Medicine

## 2017-07-05 DIAGNOSIS — H401131 Primary open-angle glaucoma, bilateral, mild stage: Secondary | ICD-10-CM | POA: Diagnosis not present

## 2017-07-13 ENCOUNTER — Encounter (INDEPENDENT_AMBULATORY_CARE_PROVIDER_SITE_OTHER): Payer: Self-pay | Admitting: *Deleted

## 2017-07-13 ENCOUNTER — Ambulatory Visit (INDEPENDENT_AMBULATORY_CARE_PROVIDER_SITE_OTHER): Payer: Medicare Other | Admitting: Internal Medicine

## 2017-07-13 ENCOUNTER — Encounter (INDEPENDENT_AMBULATORY_CARE_PROVIDER_SITE_OTHER): Payer: Self-pay | Admitting: Internal Medicine

## 2017-07-13 ENCOUNTER — Telehealth (INDEPENDENT_AMBULATORY_CARE_PROVIDER_SITE_OTHER): Payer: Self-pay | Admitting: *Deleted

## 2017-07-13 VITALS — BP 174/80 | HR 76 | Temp 97.6°F | Ht 65.5 in | Wt 128.7 lb

## 2017-07-13 DIAGNOSIS — Z800 Family history of malignant neoplasm of digestive organs: Secondary | ICD-10-CM | POA: Insufficient documentation

## 2017-07-13 DIAGNOSIS — K635 Polyp of colon: Secondary | ICD-10-CM

## 2017-07-13 MED ORDER — PEG 3350-KCL-NA BICARB-NACL 420 G PO SOLR: 4000.0000 mL | Freq: Once | ORAL | 0 refills | Status: AC

## 2017-07-13 NOTE — Patient Instructions (Signed)
The risks of bleeding, perforation and infection were reviewed with patient.  

## 2017-07-13 NOTE — Progress Notes (Signed)
Subjective:    Patient ID: Amy Rojas, female    DOB: [birth year], 77 y.o.   MRN: [id number]  HPI Referred by Dr Willey Blade for abdominal pain. She is due for a colonoscopy for family hx of colon cancer. She also tells me today she feels bloated. She has bloating in the evening.  In December she had diverticulitis and was treated by Dr. Willey Blade with Cipro and Flagyl. In January she again had diverticulitis and was given Rx for Amoxicillin.  She says she has a dropped bladder.  She also tell me she sometimes sees blood in her panties. She cannot tell me if it is from her rectum or her vagina. When she has a BM, she feels better. She does use Colace at times for occasional constipation.  She is not having any abdominal pain.  She denies any acid reflux. She has a BM regular. She has not seen any blood in her stool.  Her appetite is good.     Last colonoscopy in 2012 for hx of colon polyp[s and family hx of colon cancer in a maternal uncle at age 75 and 2 1st cousins of colon carcinoma in their 27s.  FINAL DIAGNOSES: 1. Examination performed to cecum. 2. Single arteriovenous malformation over ileocecal valve without     stigmata of bleeding, was left alone. 3. Multiple diverticula at the sigmoid colon and small external     hemorrhoids.   Retired from [employer]  Diabetic x 5 yrs.   HA1C 5.9./ 05/06/2017 total bili 0.2, ALP 67, AST 21, ALT 15, H and H 12.3 AND 37.7.     Review of Systems Past Medical History:  Diagnosis Date  . Borderline diabetic   . Diverticulosis   . High cholesterol     Past Surgical History:  Procedure Laterality Date  . cataracts    . CHOLECYSTECTOMY    . EYE SURGERY    . High cholesterol    . lazy eye surgery     x 2 over the years    Allergies  Allergen Reactions  . Sulfa Antibiotics     Cystitis as a child    Current Outpatient Medications on File Prior to Visit  Medication Sig Dispense Refill  . aspirin 81 MG tablet Take 81 mg by mouth  daily.    Marland Kitchen atorvastatin (LIPITOR) 20 MG tablet Take 20 mg by mouth daily.    Marland Kitchen latanoprost (XALATAN) 0.005 % ophthalmic solution Place 1 drop into both eyes at bedtime.    Marland Kitchen losartan (COZAAR) 100 MG tablet Take 100 mg by mouth daily.    . metFORMIN (GLUCOPHAGE) 500 MG tablet Take 500 mg by mouth daily with breakfast.     No current facility-administered medications on file prior to visit.         Objective:   Physical Exam Blood pressure (!) 174/80, pulse 76, temperature 97.6 F (36.4 C), height 5' 5.5" (1.664 m), weight 128 lb 11.2 oz (58.4 kg). Alert and oriented. Skin warm and dry. Oral mucosa is moist.   . Sclera anicteric, conjunctivae is pink. Thyroid not enlarged. No cervical lymphadenopathy. Lungs clear. Heart regular rate and rhythm.  Abdomen is soft. Bowel sounds are positive. No hepatomegaly. No abdominal masses felt. No tenderness.  No edema to lower extremities.           Assessment & Plan:  Family hx of colon cancer, Hx of colon polyps, Bloating. Needs surveillance colonoscopy. The risks of bleeding, perforation and infection were  reviewed with patient.

## 2017-07-13 NOTE — Telephone Encounter (Signed)
Patient needs trilyte 

## 2017-08-24 DIAGNOSIS — K5792 Diverticulitis of intestine, part unspecified, without perforation or abscess without bleeding: Secondary | ICD-10-CM | POA: Diagnosis not present

## 2017-08-24 DIAGNOSIS — E119 Type 2 diabetes mellitus without complications: Secondary | ICD-10-CM | POA: Diagnosis not present

## 2017-08-24 DIAGNOSIS — I10 Essential (primary) hypertension: Secondary | ICD-10-CM | POA: Diagnosis not present

## 2017-08-30 DIAGNOSIS — E785 Hyperlipidemia, unspecified: Secondary | ICD-10-CM | POA: Diagnosis not present

## 2017-08-30 DIAGNOSIS — N183 Chronic kidney disease, stage 3 (moderate): Secondary | ICD-10-CM | POA: Diagnosis not present

## 2017-08-30 DIAGNOSIS — E1129 Type 2 diabetes mellitus with other diabetic kidney complication: Secondary | ICD-10-CM | POA: Diagnosis not present

## 2017-09-02 DIAGNOSIS — E1122 Type 2 diabetes mellitus with diabetic chronic kidney disease: Secondary | ICD-10-CM | POA: Diagnosis not present

## 2017-09-02 DIAGNOSIS — N183 Chronic kidney disease, stage 3 (moderate): Secondary | ICD-10-CM | POA: Diagnosis not present

## 2017-09-24 DIAGNOSIS — K5792 Diverticulitis of intestine, part unspecified, without perforation or abscess without bleeding: Secondary | ICD-10-CM | POA: Diagnosis not present

## 2017-09-25 DIAGNOSIS — I447 Left bundle-branch block, unspecified: Secondary | ICD-10-CM | POA: Diagnosis not present

## 2017-11-23 DIAGNOSIS — H401131 Primary open-angle glaucoma, bilateral, mild stage: Secondary | ICD-10-CM | POA: Diagnosis not present

## 2017-12-03 DIAGNOSIS — R079 Chest pain, unspecified: Secondary | ICD-10-CM | POA: Diagnosis not present

## 2017-12-03 DIAGNOSIS — I447 Left bundle-branch block, unspecified: Secondary | ICD-10-CM | POA: Diagnosis not present

## 2017-12-09 ENCOUNTER — Encounter (HOSPITAL_COMMUNITY): Payer: Self-pay | Admitting: *Deleted

## 2017-12-09 ENCOUNTER — Ambulatory Visit (HOSPITAL_COMMUNITY)
Admission: RE | Admit: 2017-12-09 | Discharge: 2017-12-09 | Disposition: A | Discharge status: Home / Self Care | Payer: Medicare Other | Source: Ambulatory Visit | Attending: Internal Medicine | Admitting: Internal Medicine

## 2017-12-09 ENCOUNTER — Encounter (HOSPITAL_COMMUNITY)
Admission: RE | Disposition: A | Discharge status: Home / Self Care | Payer: Self-pay | Source: Ambulatory Visit | Attending: Internal Medicine

## 2017-12-09 ENCOUNTER — Other Ambulatory Visit: Payer: Self-pay

## 2017-12-09 DIAGNOSIS — Z800 Family history of malignant neoplasm of digestive organs: Secondary | ICD-10-CM | POA: Diagnosis not present

## 2017-12-09 DIAGNOSIS — Z7982 Long term (current) use of aspirin: Secondary | ICD-10-CM | POA: Insufficient documentation

## 2017-12-09 DIAGNOSIS — K635 Polyp of colon: Secondary | ICD-10-CM

## 2017-12-09 DIAGNOSIS — Z09 Encounter for follow-up examination after completed treatment for conditions other than malignant neoplasm: Secondary | ICD-10-CM | POA: Diagnosis not present

## 2017-12-09 DIAGNOSIS — K5520 Angiodysplasia of colon without hemorrhage: Secondary | ICD-10-CM | POA: Insufficient documentation

## 2017-12-09 DIAGNOSIS — K644 Residual hemorrhoidal skin tags: Secondary | ICD-10-CM | POA: Insufficient documentation

## 2017-12-09 DIAGNOSIS — Z7984 Long term (current) use of oral hypoglycemic drugs: Secondary | ICD-10-CM | POA: Insufficient documentation

## 2017-12-09 DIAGNOSIS — R7303 Prediabetes: Secondary | ICD-10-CM | POA: Diagnosis not present

## 2017-12-09 DIAGNOSIS — Z79899 Other long term (current) drug therapy: Secondary | ICD-10-CM | POA: Insufficient documentation

## 2017-12-09 DIAGNOSIS — E7800 Pure hypercholesterolemia, unspecified: Secondary | ICD-10-CM | POA: Diagnosis not present

## 2017-12-09 DIAGNOSIS — K5730 Diverticulosis of large intestine without perforation or abscess without bleeding: Secondary | ICD-10-CM | POA: Insufficient documentation

## 2017-12-09 DIAGNOSIS — Z86010 Personal history of colonic polyps: Secondary | ICD-10-CM | POA: Diagnosis not present

## 2017-12-09 DIAGNOSIS — Z1211 Encounter for screening for malignant neoplasm of colon: Secondary | ICD-10-CM | POA: Diagnosis not present

## 2017-12-09 HISTORY — PX: COLONOSCOPY: SHX5424

## 2017-12-09 HISTORY — DX: Diverticulitis of intestine, part unspecified, without perforation or abscess without bleeding: K57.92

## 2017-12-09 LAB — GLUCOSE, CAPILLARY: GLUCOSE-CAPILLARY: 126 mg/dL — AB (ref 70–99)

## 2017-12-09 SURGERY — COLONOSCOPY
Anesthesia: Moderate Sedation

## 2017-12-09 MED ORDER — MEPERIDINE HCL 50 MG/ML IJ SOLN
INTRAMUSCULAR | Status: DC | PRN
  Administered 2017-12-09 (×2): 25 mg via INTRAVENOUS

## 2017-12-09 MED ORDER — MIDAZOLAM HCL 5 MG/5ML IJ SOLN
INTRAMUSCULAR | Status: AC
  Filled 2017-12-09: qty 10

## 2017-12-09 MED ORDER — MEPERIDINE HCL 50 MG/ML IJ SOLN
INTRAMUSCULAR | Status: AC
  Filled 2017-12-09: qty 1

## 2017-12-09 MED ORDER — SODIUM CHLORIDE 0.9 % IV SOLN
INTRAVENOUS | Status: DC
  Administered 2017-12-09: 12:00:00 via INTRAVENOUS

## 2017-12-09 MED ORDER — STERILE WATER FOR IRRIGATION IR SOLN
Status: DC | PRN
  Administered 2017-12-09: 12:00:00

## 2017-12-09 MED ORDER — MIDAZOLAM HCL 5 MG/5ML IJ SOLN
INTRAMUSCULAR | Status: DC | PRN
  Administered 2017-12-09 (×2): 2 mg via INTRAVENOUS
  Administered 2017-12-09: 1 mg via INTRAVENOUS

## 2017-12-09 NOTE — Discharge Instructions (Signed)
Resume usual medications including aspirin as before. Modified carb high-fiber diet. No driving for 24 hours.   Colonoscopy, Adult, Care After This sheet gives you information about how to care for yourself after your procedure. Your doctor may also give you more specific instructions. If you have problems or questions, call your doctor. Follow these instructions at home: General instructions   For the first 24 hours after the procedure: ? Do not drive or use machinery. ? Do not sign important documents. ? Do not drink alcohol. ? Do your daily activities more slowly than normal. ? Eat foods that are soft and easy to digest. ? Rest often.  Take over-the-counter or prescription medicines only as told by your doctor.  It is up to you to get the results of your procedure. Ask your doctor, or the department performing the procedure, when your results will be ready. To help cramping and bloating:  Try walking around.  Put heat on your belly (abdomen) as told by your doctor. Use a heat source that your doctor recommends, such as a moist heat pack or a heating pad. ? Put a towel between your skin and the heat source. ? Leave the heat on for 20-30 minutes. ? Remove the heat if your skin turns bright red. This is especially important if you cannot feel pain, heat, or cold. You can get burned. Eating and drinking  Drink enough fluid to keep your pee (urine) clear or pale yellow.  Return to your normal diet as told by your doctor. Avoid heavy or fried foods that are hard to digest.  Avoid drinking alcohol for as long as told by your doctor. Contact a doctor if:  You have blood in your poop (stool) 2-3 days after the procedure. Get help right away if:  You have more than a small amount of blood in your poop.  You see large clumps of tissue (blood clots) in your poop.  Your belly is swollen.  You feel sick to your stomach (nauseous).  You throw up (vomit).  You have a  fever.  You have belly pain that gets worse, and medicine does not help your pain. This information is not intended to replace advice given to you by your health care provider. Make sure you discuss any questions you have with your health care provider. Document Released: 02/07/2010 Document Revised: 09/30/2015 Document Reviewed: 09/30/2015 Elsevier Interactive Patient Education  2017 [employer]  Hemorrhoids Hemorrhoids are swollen veins in and around the rectum or anus. There are two types of hemorrhoids:  Internal hemorrhoids. These occur in the veins that are just inside the rectum. They may poke through to the outside and become irritated and painful.  External hemorrhoids. These occur in the veins that are outside of the anus and can be felt as a painful swelling or hard lump near the anus.  Most hemorrhoids do not cause serious problems, and they can be managed with home treatments such as diet and lifestyle changes. If home treatments do not help your symptoms, procedures can be done to shrink or remove the hemorrhoids. What are the causes? This condition is caused by increased pressure in the anal area. This pressure may result from various things, including:  Constipation.  Straining to have a bowel movement.  Diarrhea.  Pregnancy.  Obesity.  Sitting for long periods of time.  Heavy lifting or other activity that causes you to strain.  Anal sex.  What are the signs or symptoms? Symptoms of this condition include:  Pain.  Anal itching or irritation.  Rectal bleeding.  Leakage of stool (feces).  Anal swelling.  One or more lumps around the anus.  How is this diagnosed? This condition can often be diagnosed through a visual exam. Other exams or tests may also be done, such as:  Examination of the rectal area with a gloved hand (digital rectal exam).  Examination of the anal canal using a small tube (anoscope).  A blood test, if you have lost a  significant amount of blood.  A test to look inside the colon (sigmoidoscopy or colonoscopy).  How is this treated? This condition can usually be treated at home. However, various procedures may be done if dietary changes, lifestyle changes, and other home treatments do not help your symptoms. These procedures can help make the hemorrhoids smaller or remove them completely. Some of these procedures involve surgery, and others do not. Common procedures include:  Rubber band ligation. Rubber bands are placed at the base of the hemorrhoids to cut off the blood supply to them.  Sclerotherapy. Medicine is injected into the hemorrhoids to shrink them.  Infrared coagulation. A type of light energy is used to get rid of the hemorrhoids.  Hemorrhoidectomy surgery. The hemorrhoids are surgically removed, and the veins that supply them are tied off.  Stapled hemorrhoidopexy surgery. A circular stapling device is used to remove the hemorrhoids and use staples to cut off the blood supply to them.  Follow these instructions at home: Eating and drinking  Eat foods that have a lot of fiber in them, such as whole grains, beans, nuts, fruits, and vegetables. Ask your health care provider about taking products that have added fiber (fiber supplements).  Drink enough fluid to keep your urine clear or pale yellow. Managing pain and swelling  Take warm sitz baths for 20 minutes, 3-4 times a day to ease pain and discomfort.  If directed, apply ice to the affected area. Using ice packs between sitz baths may be helpful. ? Put ice in a plastic bag. ? Place a towel between your skin and the bag. ? Leave the ice on for 20 minutes, 2-3 times a day. General instructions  Take over-the-counter and prescription medicines only as told by your health care provider.  Use medicated creams or suppositories as told.  Exercise regularly.  Go to the bathroom when you have the urge to have a bowel movement. Do not  wait.  Avoid straining to have bowel movements.  Keep the anal area dry and clean. Use wet toilet paper or moist towelettes after a bowel movement.  Do not sit on the toilet for long periods of time. This increases blood pooling and pain. Contact a health care provider if:  You have increasing pain and swelling that are not controlled by treatment or medicine.  You have uncontrolled bleeding.  You have difficulty having a bowel movement, or you are unable to have a bowel movement.  You have pain or inflammation outside the area of the hemorrhoids. This information is not intended to replace advice given to you by your health care provider. Make sure you discuss any questions you have with your health care provider. Document Released: 01/03/2000 Document Revised: 06/05/2015 Document Reviewed: 09/19/2014 Elsevier Interactive Patient Education  2018 [employer].  Diverticulosis Diverticulosis is a condition that develops when small pouches (diverticula) form in the wall of the large intestine (colon). The colon is where water is absorbed and stool is formed. The pouches form when the inside  layer of the colon pushes through weak spots in the outer layers of the colon. You may have a few pouches or many of them. What are the causes? The cause of this condition is not known. What increases the risk? The following factors may make you more likely to develop this condition:  Being older than age 33. Your risk for this condition increases with age. Diverticulosis is rare among people younger than age 42. By age 52, many people have it.  Eating a low-fiber diet.  Having frequent constipation.  Being overweight.  Not getting enough exercise.  Smoking.  Taking over-the-counter pain medicines, like aspirin and ibuprofen.  Having a family history of diverticulosis.  What are the signs or symptoms? In most people, there are no symptoms of this condition. If you do have symptoms, they  may include:  Bloating.  Cramps in the abdomen.  Constipation or diarrhea.  Pain in the lower left side of the abdomen.  How is this diagnosed? This condition is most often diagnosed during an exam for other colon problems. Because diverticulosis usually has no symptoms, it often cannot be diagnosed independently. This condition may be diagnosed by:  Using a flexible scope to examine the colon (colonoscopy).  Taking an X-ray of the colon after dye has been put into the colon (barium enema).  Doing a CT scan.  How is this treated? You may not need treatment for this condition if you have never developed an infection related to diverticulosis. If you have had an infection before, treatment may include:  Eating a high-fiber diet. This may include eating more fruits, vegetables, and grains.  Taking a fiber supplement.  Taking a live bacteria supplement (probiotic).  Taking medicine to relax your colon.  Taking antibiotic medicines.  Follow these instructions at home:  Drink 6-8 glasses of water or more each day to prevent constipation.  Try not to strain when you have a bowel movement.  If you have had an infection before: ? Eat more fiber as directed by your health care provider or your diet and nutrition specialist (dietitian). ? Take a fiber supplement or probiotic, if your health care provider approves.  Take over-the-counter and prescription medicines only as told by your health care provider.  If you were prescribed an antibiotic, take it as told by your health care provider. Do not stop taking the antibiotic even if you start to feel better.  Keep all follow-up visits as told by your health care provider. This is important. Contact a health care provider if:  You have pain in your abdomen.  You have bloating.  You have cramps.  You have not had a bowel movement in 3 days. Get help right away if:  Your pain gets worse.  Your bloating becomes very  bad.  You have a fever or chills, and your symptoms suddenly get worse.  You vomit.  You have bowel movements that are bloody or black.  You have bleeding from your rectum. Summary  Diverticulosis is a condition that develops when small pouches (diverticula) form in the wall of the large intestine (colon).  You may have a few pouches or many of them.  This condition is most often diagnosed during an exam for other colon problems.  If you have had an infection related to diverticulosis, treatment may include increasing the fiber in your diet, taking supplements, or taking medicines. This information is not intended to replace advice given to you by your health care provider. Make sure  you discuss any questions you have with your health care provider. Document Released: 10/03/2003 Document Revised: 11/25/2015 Document Reviewed: 11/25/2015 Elsevier Interactive Patient Education  2017 [employer].

## 2017-12-09 NOTE — Op Note (Addendum)
[facility] Patient Name: Amy Rojas Procedure Date: 12/09/2017 11:00 AM MRN: [id number] Date of Birth: 1940/02/14 Attending MD: Hildred Laser , MD CSN: [id number] Age: 77 Admit Type: Outpatient Procedure:                Colonoscopy Indications:              High risk colon cancer surveillance: Personal                            history of colonic polyps, Family history of colon                            cancer in multiple second-degree relatives Providers:                Hildred Laser, MD, Otis Peak B. Sharon Seller, RN, Aram Candela Referring MD:             Asencion Noble, MD Medicines:                Meperidine 50 mg IV, Midazolam 5 mg IV Complications:            No immediate complications. Estimated Blood Loss:     Estimated blood loss: none. Procedure:                Pre-Anesthesia Assessment:                           - Prior to the procedure, a History and Physical                            was performed, and patient medications and                            allergies were reviewed. The patient's tolerance of                            previous anesthesia was also reviewed. The risks                            and benefits of the procedure and the sedation                            options and risks were discussed with the patient.                            All questions were answered, and informed consent                            was obtained. Prior Anticoagulants: The patient                            last took aspirin 5 days prior to the procedure.  ASA Grade Assessment: II - A patient with mild                            systemic disease. After reviewing the risks and                            benefits, the patient was deemed in satisfactory                            condition to undergo the procedure.                           After obtaining informed consent, the colonoscope                            was  passed under direct vision. Throughout the                            procedure, the patient's blood pressure, pulse, and                            oxygen saturations were monitored continuously. The                            [id number]-[id number] ([id number]) scope was introduced through                            the anus and advanced to the the cecum, identified                            by appendiceal orifice and ileocecal valve. The                            patient tolerated the procedure well. The quality                            of the bowel preparation was good. The colonoscopy                            was somewhat difficult due to multiple diverticula                            in the colon. The patient tolerated the procedure                            well. Scope In: 12:28:44 PM Scope Out: 12:54:50 PM Scope Withdrawal Time: 0 hours 7 minutes 16 seconds  Total Procedure Duration: 0 hours 26 minutes 6 seconds  Findings:      Skin tags were found on perianal exam.      A single medium-sized angioectasia without bleeding was found at the       ileocecal valve.      Multiple small and large-mouthed diverticula were found in the sigmoid       colon.  The exam was otherwise normal throughout the examined colon.      External hemorrhoids were found during retroflexion. The hemorrhoids       were medium-sized. Impression:               - Perianal skin tags found on perianal exam.                           - A single non-bleeding colonic angioectasia at                            ileocecal valve.                           - Diverticulosis in the sigmoid colon.                           - External hemorrhoids.                           - No specimens collected. Moderate Sedation:      Moderate (conscious) sedation was administered by the endoscopy nurse       and supervised by the endoscopist. The following parameters were       monitored: oxygen saturation, heart rate, blood  pressure, CO2       capnography and response to care. Total physician intraservice time was       31 minutes. Recommendation:           - Patient has a contact number available for                            emergencies. The signs and symptoms of potential                            delayed complications were discussed with the                            patient. Return to normal activities tomorrow.                            Written discharge instructions were provided to the                            patient.                           - High fiber diet and diabetic (ADA) diet today.                           - Continue present medications.                           - Resume aspirin at prior dose today.                           - No recommendation at this time regarding repeat  colonoscopy. Procedure Code(s):        --- Professional ---                           [phone], Colonoscopy, flexible; diagnostic, including                            collection of specimen(s) by brushing or washing,                            when performed (separate procedure)                           99153, Moderate sedation; each additional 15                            minutes intraservice time                           G0500, Moderate sedation services provided by the                            same physician or other qualified health care                            professional performing a gastrointestinal                            endoscopic service that sedation supports,                            requiring the presence of an independent trained                            observer to assist in the monitoring of the                            patient's level of consciousness and physiological                            status; initial 15 minutes of intra-service time;                            patient age 37 years or older (additional time may                            be  reported with [phone], as appropriate) Diagnosis Code(s):        --- Professional ---                           K64.4, Residual hemorrhoidal skin tags                           Z86.010, Personal history of colonic polyps  K55.20, Angiodysplasia of colon without hemorrhage                           Z80.0, Family history of malignant neoplasm of                            digestive organs                           K57.30, Diverticulosis of large intestine without                            perforation or abscess without bleeding CPT copyright 2018 American [employer] Association. All rights reserved. The codes documented in this report are preliminary and upon coder review may  be revised to meet current compliance requirements. Hildred Laser, MD Hildred Laser, MD 12/09/2017 1:05:06 PM This report has been signed electronically. Number of Addenda: 0

## 2017-12-09 NOTE — H&P (Signed)
Amy Rojas is an 77 y.o. female.   Chief Complaint: Patient is in for colonoscopy. HPI: Patient is 77 year old Caucasian female with history of colonic adenomas and family history of CRC who is here for surveillance colonoscopy.  She had 2 adenomas removed on her first colonoscopy which she had none on her last exam of May 2012.  Her bowels are usually regular.  Occasionally she may go a couple of days without a bowel movement.  She denies rectal bleeding.  She states she has been treated for diverticulitis 4 times this year.  She was successfully treated by Dr. Willey Blade on an outpatient basis.  She does not take any NSAIDs other than those aspirin.  She has been off aspirin for 5 days. Family history significant for CRC and maternal uncle and 2 of his sons were in the 2s and heart disease.  Past Medical History:  Diagnosis Date  . Borderline diabetic   . Diverticulitis   . Diverticulosis   . High cholesterol     Past Surgical History:  Procedure Laterality Date  . cataracts    . CHOLECYSTECTOMY    . EYE SURGERY    . High cholesterol    . lazy eye surgery     x 2 over the years    Family History  Problem Relation Age of Onset  . Arthritis Unknown   . Diabetes Unknown   . Kidney disease Unknown   . Colon cancer Maternal Uncle    Social History:  reports that she has never smoked. She has never used smokeless tobacco. She reports that she does not drink alcohol or use drugs.  Allergies:  Allergies  Allergen Reactions  . Sulfa Antibiotics Other (See Comments)    Cystitis as a child    Medications Prior to Admission  Medication Sig Dispense Refill  . aspirin 81 MG tablet Take 81 mg by mouth daily.    Marland Kitchen atorvastatin (LIPITOR) 20 MG tablet Take 20 mg by mouth daily.    Marland Kitchen latanoprost (XALATAN) 0.005 % ophthalmic solution Place 1 drop into both eyes at bedtime.    Marland Kitchen losartan (COZAAR) 100 MG tablet Take 100 mg by mouth daily.    . metFORMIN (GLUCOPHAGE) 500 MG tablet Take 500  mg by mouth 2 (two) times daily with a meal.       Results for orders placed or performed during the hospital encounter of 12/09/17 (from the past 48 hour(s))  Glucose, capillary     Status: Abnormal   Collection Time: 12/09/17 11:34 AM  Result Value Ref Range   Glucose-Capillary 126 (H) 70 - 99 mg/dL   No results found.  ROS  Blood pressure (!) 160/81, pulse 73, temperature 98.7 F (37.1 C), temperature source Oral, resp. rate 18, height 5' 5.5" (1.664 m), weight 55.8 kg, SpO2 96 %. Physical Exam  Constitutional: She appears well-developed.  HENT:  Mouth/Throat: Oropharynx is clear and moist.  Eyes: Conjunctivae are normal. No scleral icterus.  Neck: No thyromegaly present.  Cardiovascular: Normal rate, regular rhythm and normal heart sounds.  No murmur heard. Respiratory: Effort normal and breath sounds normal.  GI: Soft. She exhibits no distension and no mass. There is no tenderness.  Musculoskeletal: She exhibits no edema.  Lymphadenopathy:    She has no cervical adenopathy.  Neurological: She is alert.  Skin: Skin is warm and dry.     Assessment/Plan History of colonic adenomas. Family history of CRC. Surveillance colonoscopy.  Hildred Laser, MD 12/09/2017, 12:18 PM

## 2017-12-15 ENCOUNTER — Encounter (HOSPITAL_COMMUNITY): Payer: Self-pay | Admitting: Internal Medicine

## 2017-12-15 DIAGNOSIS — H8110 Benign paroxysmal vertigo, unspecified ear: Secondary | ICD-10-CM | POA: Diagnosis not present

## 2017-12-30 DIAGNOSIS — Z79899 Other long term (current) drug therapy: Secondary | ICD-10-CM | POA: Diagnosis not present

## 2017-12-30 DIAGNOSIS — I447 Left bundle-branch block, unspecified: Secondary | ICD-10-CM | POA: Diagnosis not present

## 2017-12-30 DIAGNOSIS — E1129 Type 2 diabetes mellitus with other diabetic kidney complication: Secondary | ICD-10-CM | POA: Diagnosis not present

## 2017-12-30 DIAGNOSIS — N183 Chronic kidney disease, stage 3 (moderate): Secondary | ICD-10-CM | POA: Diagnosis not present

## 2018-01-06 DIAGNOSIS — N183 Chronic kidney disease, stage 3 (moderate): Secondary | ICD-10-CM | POA: Diagnosis not present

## 2018-01-06 DIAGNOSIS — Z23 Encounter for immunization: Secondary | ICD-10-CM | POA: Diagnosis not present

## 2018-01-06 DIAGNOSIS — E1122 Type 2 diabetes mellitus with diabetic chronic kidney disease: Secondary | ICD-10-CM | POA: Diagnosis not present

## 2018-01-06 DIAGNOSIS — S61511A Laceration without foreign body of right wrist, initial encounter: Secondary | ICD-10-CM | POA: Diagnosis not present

## 2018-01-13 ENCOUNTER — Ambulatory Visit (HOSPITAL_COMMUNITY)
Admission: EM | Admit: 2018-01-13 | Discharge: 2018-01-13 | Disposition: A | Discharge status: Home / Self Care | Payer: Medicare Other | Attending: Family Medicine | Admitting: Family Medicine

## 2018-01-13 ENCOUNTER — Encounter (HOSPITAL_COMMUNITY): Payer: Self-pay | Admitting: Emergency Medicine

## 2018-01-13 DIAGNOSIS — T7840XA Allergy, unspecified, initial encounter: Secondary | ICD-10-CM | POA: Diagnosis not present

## 2018-01-13 MED ORDER — PREDNISONE 50 MG PO TABS: ORAL_TABLET | ORAL | 0 refills | Status: DC

## 2018-01-13 NOTE — Discharge Instructions (Addendum)
This is a reaction to the tetanus shot.  Notify your doctor so he doesn't give you another.

## 2018-01-13 NOTE — ED Triage Notes (Signed)
Pt presents to [facility] for assessment of rash to left shoulder where she got the tetanus shot last Thursday.  Rash began to develop last night.  Denies SOB, denies difficulty swallowing, denies pain.

## 2018-01-13 NOTE — ED Provider Notes (Signed)
Amy Rojas    CSN: [id number] Arrival date & time: 01/13/18  1357     History   Chief Complaint Chief Complaint  Patient presents with  . Allergic Reaction    HPI Amy Rojas is a 77 y.o. female.   This is a 77 year old woman making her initial visit to [facility] urgent care.  She is here for evaluation of rash to left shoulder where she got the tetanus shot last Thursday.  Rash began to develop last night.  Denies SOB, denies difficulty swallowing, denies pain.      Past Medical History:  Diagnosis Date  . Borderline diabetic   . Diverticulitis   . Diverticulosis   . High cholesterol     Patient Active Problem List   Diagnosis Date Noted  . Family hx of colon cancer 07/13/2017  . Polyp of colon 07/13/2017  . Left bundle branch block 05/15/2013  . Type II or unspecified type diabetes mellitus without mention of complication, not stated as uncontrolled 05/12/2013  . Other and unspecified hyperlipidemia 05/12/2013  . Proteinuria 05/12/2013  . Renal failure, unspecified 05/12/2013  . Mallet finger of right hand 12/24/2011    Past Surgical History:  Procedure Laterality Date  . cataracts    . CHOLECYSTECTOMY    . COLONOSCOPY N/A 12/09/2017   Procedure: COLONOSCOPY;  Surgeon: Rogene Houston, MD;  [facility];  Service: Endoscopy;  Laterality: N/A;  1200  . EYE SURGERY    . High cholesterol    . lazy eye surgery     x 2 over the years    OB History   No obstetric history on file.      Home Medications    Prior to Admission medications   Medication Sig Start Date End Date Taking? Authorizing Provider  aspirin 81 MG tablet Take 81 mg by mouth daily.   Yes [provider]  atorvastatin (LIPITOR) 20 MG tablet Take 20 mg by mouth daily.   Yes [provider]  latanoprost (XALATAN) 0.005 % ophthalmic solution Place 1 drop into both eyes at bedtime.   Yes [provider]  losartan (COZAAR) 100 MG  tablet Take 100 mg by mouth daily.   Yes [provider]  metFORMIN (GLUCOPHAGE) 500 MG tablet Take 500 mg by mouth 2 (two) times daily with a meal.    Yes [provider]    Family History Family History  Problem Relation Age of Onset  . Arthritis Other   . Diabetes Other   . Kidney disease Other   . Colon cancer Maternal Uncle     Social History Social History   Tobacco Use  . Smoking status: Never Smoker  . Smokeless tobacco: Never Used  Substance Use Topics  . Alcohol use: No  . Drug use: No     Allergies   Sulfa antibiotics   Review of Systems Review of Systems   Physical Exam Triage Vital Signs ED Triage Vitals [01/13/18 1516]  Enc Vitals Group     BP (!) 143/74     Pulse Rate 80     Resp 16     Temp 97.8 F (36.6 C)     Temp Source Oral     SpO2 100 %     Weight      Height      Head Circumference      Peak Flow      Pain Score 0     Pain Loc  Pain Edu?      Excl. in [place]?    No data found.  Updated Vital Signs BP (!) 143/74 (BP Location: Right Arm)   Pulse 80   Temp 97.8 F (36.6 C) (Oral)   Resp 16   SpO2 100%    Physical Exam Vitals signs and nursing note reviewed.  Constitutional:      General: She is not in acute distress.    Appearance: Normal appearance. She is not ill-appearing.  HENT:     Head: Normocephalic.     Nose: Nose normal.     Mouth/Throat:     Mouth: Mucous membranes are moist.  Eyes:     Conjunctiva/sclera: Conjunctivae normal.     Comments: Patient has a exophoria of left eye  Neck:     Musculoskeletal: Normal range of motion and neck supple.  Cardiovascular:     Heart sounds: Normal heart sounds.  Pulmonary:     Effort: Pulmonary effort is normal.     Breath sounds: Normal breath sounds.  Skin:    General: Skin is warm and dry.     Findings: Rash present.     Comments: Patient has a macular rash measuring 3 x 5 cm on her left shoulder at the site of her tetanus shot.  Neurological:      General: No focal deficit present.     Mental Status: She is alert.  Psychiatric:        Mood and Affect: Mood normal.      UC Treatments / Results  Labs (all labs ordered are listed, but only abnormal results are displayed) Labs Reviewed - No data to display  EKG None  Radiology No results found.  Procedures Procedures (including critical care time)  Medications Ordered in UC Medications - No data to display  Initial Impression / Assessment and Plan / UC Course  I have reviewed the triage vital signs and the nursing notes.  Pertinent labs & imaging results that were available during my care of the patient were reviewed by me and considered in my medical decision making (see chart for details).    Final Clinical Impressions(s) / UC Diagnoses   Final diagnoses:  None   Discharge Instructions   None    ED Prescriptions    None     Controlled Substance Prescriptions [place] Controlled Substance Registry consulted? Not Applicable   Robyn Haber, MD 01/13/18 1531

## 2018-01-27 ENCOUNTER — Other Ambulatory Visit (HOSPITAL_COMMUNITY): Payer: Self-pay | Admitting: Internal Medicine

## 2018-01-27 DIAGNOSIS — Z1231 Encounter for screening mammogram for malignant neoplasm of breast: Secondary | ICD-10-CM

## 2018-02-02 ENCOUNTER — Ambulatory Visit (HOSPITAL_COMMUNITY): Payer: Medicare Other

## 2018-02-04 ENCOUNTER — Ambulatory Visit (HOSPITAL_COMMUNITY): Payer: Medicare Other

## 2018-02-07 ENCOUNTER — Ambulatory Visit (HOSPITAL_COMMUNITY)
Admission: RE | Admit: 2018-02-07 | Discharge: 2018-02-07 | Disposition: A | Discharge status: Home / Self Care | Payer: Medicare Other | Source: Ambulatory Visit | Attending: Internal Medicine | Admitting: Internal Medicine

## 2018-02-07 ENCOUNTER — Encounter (HOSPITAL_COMMUNITY): Payer: Self-pay

## 2018-02-07 DIAGNOSIS — Z1231 Encounter for screening mammogram for malignant neoplasm of breast: Secondary | ICD-10-CM | POA: Insufficient documentation

## 2018-02-22 DIAGNOSIS — E119 Type 2 diabetes mellitus without complications: Secondary | ICD-10-CM | POA: Diagnosis not present

## 2018-04-14 DIAGNOSIS — K5792 Diverticulitis of intestine, part unspecified, without perforation or abscess without bleeding: Secondary | ICD-10-CM | POA: Diagnosis not present

## 2018-07-12 DIAGNOSIS — N183 Chronic kidney disease, stage 3 (moderate): Secondary | ICD-10-CM | POA: Diagnosis not present

## 2018-07-12 DIAGNOSIS — E1129 Type 2 diabetes mellitus with other diabetic kidney complication: Secondary | ICD-10-CM | POA: Diagnosis not present

## 2018-07-12 DIAGNOSIS — Z79899 Other long term (current) drug therapy: Secondary | ICD-10-CM | POA: Diagnosis not present

## 2018-07-18 DIAGNOSIS — E1122 Type 2 diabetes mellitus with diabetic chronic kidney disease: Secondary | ICD-10-CM | POA: Diagnosis not present

## 2018-07-18 DIAGNOSIS — N183 Chronic kidney disease, stage 3 (moderate): Secondary | ICD-10-CM | POA: Diagnosis not present

## 2018-07-18 DIAGNOSIS — E785 Hyperlipidemia, unspecified: Secondary | ICD-10-CM | POA: Diagnosis not present

## 2018-11-17 ENCOUNTER — Other Ambulatory Visit: Payer: Self-pay

## 2018-11-17 NOTE — Patient Outreach (Signed)
[facility]) Care Management  11/17/2018  Amy Rojas 1940-06-05 [id number]   Medication Adherence call to [name] [facility] Identifiers Verify spoke with patient she is past due on Losartan 100 mg,paient explain she takes this medication once midday as a preventative not for blood pressure,patient said sometimes she forgets to take it,patient has enough for a week on her pill box and will order from [place] in a couple of days. [name] is showing past due under [place].   [facility] Direct Dial [phone]  Fax [phone] [email or web address]

## 2018-11-23 DIAGNOSIS — N183 Chronic kidney disease, stage 3 unspecified: Secondary | ICD-10-CM | POA: Diagnosis not present

## 2018-11-23 DIAGNOSIS — Z79899 Other long term (current) drug therapy: Secondary | ICD-10-CM | POA: Diagnosis not present

## 2018-11-23 DIAGNOSIS — E785 Hyperlipidemia, unspecified: Secondary | ICD-10-CM | POA: Diagnosis not present

## 2018-11-23 DIAGNOSIS — E1129 Type 2 diabetes mellitus with other diabetic kidney complication: Secondary | ICD-10-CM | POA: Diagnosis not present

## 2018-11-23 DIAGNOSIS — Z23 Encounter for immunization: Secondary | ICD-10-CM | POA: Diagnosis not present

## 2018-11-28 DIAGNOSIS — E1122 Type 2 diabetes mellitus with diabetic chronic kidney disease: Secondary | ICD-10-CM | POA: Diagnosis not present

## 2018-11-28 DIAGNOSIS — N183 Chronic kidney disease, stage 3 unspecified: Secondary | ICD-10-CM | POA: Diagnosis not present

## 2018-12-20 ENCOUNTER — Other Ambulatory Visit: Payer: Self-pay

## 2018-12-20 DIAGNOSIS — Z20822 Contact with and (suspected) exposure to covid-19: Secondary | ICD-10-CM

## 2018-12-22 LAB — NOVEL CORONAVIRUS, NAA: SARS-CoV-2, NAA: NOT DETECTED

## 2018-12-30 DIAGNOSIS — E119 Type 2 diabetes mellitus without complications: Secondary | ICD-10-CM | POA: Diagnosis not present

## 2019-02-09 ENCOUNTER — Ambulatory Visit: Payer: Medicare Other | Attending: Internal Medicine

## 2019-02-09 DIAGNOSIS — Z23 Encounter for immunization: Secondary | ICD-10-CM

## 2019-02-09 NOTE — Progress Notes (Signed)
   Covid-19 Vaccination Clinic  Name:  Amy Rojas    MRN: [id number] DOB: 06-10-1940  02/09/2019  [name] was observed post Covid-19 immunization for 15 minutes without incidence. She was provided with Vaccine Information Sheet and instruction to access the V-Safe system.   [name] was instructed to call 911 with any severe reactions post vaccine: Marland Kitchen Difficulty breathing  . Swelling of your face and throat  . A fast heartbeat  . A bad rash all over your body  . Dizziness and weakness    Immunizations Administered    Name Date Dose VIS Date Route   Pfizer COVID-19 Vaccine 02/09/2019  5:33 PM 0.3 mL 12/30/2018 Intramuscular   Manufacturer: [place]   Lot: [id number]   [place]: [id number]

## 2019-02-28 DIAGNOSIS — I447 Left bundle-branch block, unspecified: Secondary | ICD-10-CM | POA: Diagnosis not present

## 2019-02-28 DIAGNOSIS — R079 Chest pain, unspecified: Secondary | ICD-10-CM | POA: Diagnosis not present

## 2019-03-02 ENCOUNTER — Ambulatory Visit: Payer: Medicare Other | Attending: Internal Medicine

## 2019-03-02 DIAGNOSIS — Z23 Encounter for immunization: Secondary | ICD-10-CM | POA: Insufficient documentation

## 2019-03-02 NOTE — Progress Notes (Signed)
   Covid-19 Vaccination Clinic  Name:  Amy Rojas    MRN: [id number] DOB: 10-Mar-1940  03/02/2019  [name] was observed post Covid-19 immunization for 15 minutes without incidence. She was provided with Vaccine Information Sheet and instruction to access the V-Safe system.   [name] was instructed to call 911 with any severe reactions post vaccine: Marland Kitchen Difficulty breathing  . Swelling of your face and throat  . A fast heartbeat  . A bad rash all over your body  . Dizziness and weakness    Immunizations Administered    Name Date Dose VIS Date Route   Pfizer COVID-19 Vaccine 03/02/2019  1:01 PM 0.3 mL 12/30/2018 Intramuscular   Manufacturer: [place]   Lot: [id number]   [place]: [id number]

## 2019-03-23 DIAGNOSIS — E1129 Type 2 diabetes mellitus with other diabetic kidney complication: Secondary | ICD-10-CM | POA: Diagnosis not present

## 2019-03-23 DIAGNOSIS — Z79899 Other long term (current) drug therapy: Secondary | ICD-10-CM | POA: Diagnosis not present

## 2019-03-23 DIAGNOSIS — N183 Chronic kidney disease, stage 3 unspecified: Secondary | ICD-10-CM | POA: Diagnosis not present

## 2019-03-27 DIAGNOSIS — E1122 Type 2 diabetes mellitus with diabetic chronic kidney disease: Secondary | ICD-10-CM | POA: Diagnosis not present

## 2019-03-27 DIAGNOSIS — N1831 Chronic kidney disease, stage 3a: Secondary | ICD-10-CM | POA: Diagnosis not present

## 2019-03-28 DIAGNOSIS — H401131 Primary open-angle glaucoma, bilateral, mild stage: Secondary | ICD-10-CM | POA: Diagnosis not present

## 2019-05-08 ENCOUNTER — Encounter: Payer: Self-pay | Admitting: Cardiology

## 2019-05-08 ENCOUNTER — Other Ambulatory Visit: Payer: Self-pay

## 2019-05-08 ENCOUNTER — Ambulatory Visit: Payer: Medicare Other | Admitting: Cardiology

## 2019-05-08 VITALS — BP 138/60 | HR 76 | Temp 97.3°F | Ht 66.0 in | Wt 132.0 lb

## 2019-05-08 DIAGNOSIS — I447 Left bundle-branch block, unspecified: Secondary | ICD-10-CM | POA: Diagnosis not present

## 2019-05-08 DIAGNOSIS — R079 Chest pain, unspecified: Secondary | ICD-10-CM

## 2019-05-08 NOTE — Patient Instructions (Signed)
Medication Instructions:  Your physician recommends that you continue on your current medications as directed. Please refer to the Current Medication list given to you today.   Labwork: none  Testing/Procedures: Your physician has requested that you have a lexiscan myoview. For further information please visit [email or web address]. Please follow instruction sheet, as given.    Follow-Up: Your physician recommends that you schedule a follow-up appointment in: pending test resluts     Any Other Special Instructions Will Be Listed Below (If Applicable).     If you need a refill on your cardiac medications before your next appointment, please call your pharmacy.

## 2019-05-08 NOTE — Progress Notes (Signed)
Clinical Summary Ms. Goffin is a 79 y.o.female seen today as a new patient, last seen in 04/2013. Seen today for chest pain.   1. Chest pain - walks regularly, states that when walks up incline feeling of pressure in midchest 3/10, with SOB, not diaphoretic, no nausea. Resolves once get back on level ground. Symptoms started 2 months ago. Can vary in frequnecy, stable in level of exertion of onset. - No LE edema, no orthopnea, no PND - walks one mile, typically over 20 minutes  - outpatient EKG by her PCP shows LBBB, unknown duration - CAD risk factors: DM, HL,hypertriglyceridemia, age. Brother CABG 45s.   2015 nuclear stress no ischemia 2015 echo LVEF 60-65%, no WMAs, grade I DDx   - some recent discomfort. Walking up incline noticed some chest discomfort - general uncomfortable feeling mid to left chest, 1/10 in severity. +SOB/DOE.  - walks this route regularly, had not had prior issues. Symptoms resolved within a few minutes - multiple episodes walking up incline.     2. Chronic  LBBB - dating back at least to 2015 - in 2015 no significant findings by echo or nuclear stress at that time   Past Medical History:  Diagnosis Date  . Borderline diabetic   . Diverticulitis   . Diverticulosis   . High cholesterol      Allergies  Allergen Reactions  . Sulfa Antibiotics Other (See Comments)    Cystitis as a child     Current Outpatient Medications  Medication Sig Dispense Refill  . aspirin 81 MG tablet Take 81 mg by mouth daily.    Marland Kitchen atorvastatin (LIPITOR) 20 MG tablet Take 20 mg by mouth daily.    Marland Kitchen latanoprost (XALATAN) 0.005 % ophthalmic solution Place 1 drop into both eyes at bedtime.    Marland Kitchen losartan (COZAAR) 100 MG tablet Take 100 mg by mouth daily.    . metFORMIN (GLUCOPHAGE) 500 MG tablet Take 500 mg by mouth 2 (two) times daily with a meal.     . predniSONE (DELTASONE) 50 MG tablet One daily with food 2 tablet 0   No current facility-administered  medications for this visit.     Past Surgical History:  Procedure Laterality Date  . cataracts    . CHOLECYSTECTOMY    . COLONOSCOPY N/A 12/09/2017   Procedure: COLONOSCOPY;  Surgeon: Rogene Houston, MD;  [facility];  Service: Endoscopy;  Laterality: N/A;  1200  . EYE SURGERY    . High cholesterol    . lazy eye surgery     x 2 over the years     Allergies  Allergen Reactions  . Sulfa Antibiotics Other (See Comments)    Cystitis as a child      Family History  Problem Relation Age of Onset  . Arthritis Other   . Diabetes Other   . Kidney disease Other   . Colon cancer Maternal Uncle      Social History Ms. Rettinger reports that she has never smoked. She has never used smokeless tobacco. Ms. Buchwald reports no history of alcohol use.   Review of Systems CONSTITUTIONAL: No weight loss, fever, chills, weakness or fatigue.  HEENT: Eyes: No visual loss, blurred vision, double vision or yellow sclerae.No hearing loss, sneezing, congestion, runny nose or sore throat.  SKIN: No rash or itching.  CARDIOVASCULAR: per hpi RESPIRATORY: No shortness of breath, cough or sputum.  GASTROINTESTINAL: No anorexia, nausea, vomiting or diarrhea. No abdominal pain or  blood.  GENITOURINARY: No burning on urination, no polyuria NEUROLOGICAL: No headache, dizziness, syncope, paralysis, ataxia, numbness or tingling in the extremities. No change in bowel or bladder control.  MUSCULOSKELETAL: No muscle, back pain, joint pain or stiffness.  LYMPHATICS: No enlarged nodes. No history of splenectomy.  PSYCHIATRIC: No history of depression or anxiety.  ENDOCRINOLOGIC: No reports of sweating, cold or heat intolerance. No polyuria or polydipsia.  Marland Kitchen   Physical Examination Today's Vitals   05/08/19 1322  BP: 138/60  Pulse: 76  Temp: (!) 97.3 F (36.3 C)  SpO2: 98%  Weight: 132 lb (59.9 kg)  Height: 5\' 6"  (1.676 m)   Body mass index is 21.31 kg/m.  Gen: resting  comfortably, no acute distress HEENT: no scleral icterus, pupils equal round and reactive, no palptable cervical adenopathy,  CV: RRR, no m/r/v, no jvd Resp: Clear to auscultation bilaterally GI: abdomen is soft, non-tender, non-distended, normal bowel sounds, no hepatosplenomegaly MSK: extremities are warm, no edema.  Skin: warm, no rash Neuro:  no focal deficits Psych: appropriate affect   Diagnostic Studies 05/2013 nuclear stress IMPRESSION: Low risk Lexiscan Cardiolite as outlined. Left bundle  block developed during Lexiscan infusion as heart rate increased, no chest pain reported. Baseline ECG abnormal as outlined above. Perfusion imaging is most consistent with soft tissue attenuation, no clear evidence of scar or ischemia. LVEF is 81% with normal volumes and no wall motion abnormalities.    Assessment and Plan  1. Chest pain - recent exertional chest pain and SOB. Only comes on with walking up inclines, no progression of symptoms since onset - will plan for lexiscan to evaluate for any significant CAD - if abnormal stress, depending on her risk stratification likely a good candidate for medical therapy initially given symptoms are stable with relatively high levels of exertion.       Arnoldo Lenis, M.D.

## 2019-05-09 ENCOUNTER — Other Ambulatory Visit (HOSPITAL_COMMUNITY): Payer: Self-pay | Admitting: Internal Medicine

## 2019-05-09 ENCOUNTER — Ambulatory Visit (HOSPITAL_COMMUNITY)
Admission: RE | Admit: 2019-05-09 | Discharge: 2019-05-09 | Disposition: A | Discharge status: Home / Self Care | Payer: Medicare Other | Source: Ambulatory Visit | Attending: Internal Medicine | Admitting: Internal Medicine

## 2019-05-09 DIAGNOSIS — M79672 Pain in left foot: Secondary | ICD-10-CM | POA: Diagnosis not present

## 2019-05-09 DIAGNOSIS — S99922A Unspecified injury of left foot, initial encounter: Secondary | ICD-10-CM | POA: Diagnosis not present

## 2019-05-19 ENCOUNTER — Encounter (HOSPITAL_COMMUNITY)
Admission: RE | Admit: 2019-05-19 | Discharge: 2019-05-19 | Disposition: A | Discharge status: Home / Self Care | Payer: Medicare Other | Source: Ambulatory Visit | Attending: Cardiology | Admitting: Cardiology

## 2019-05-19 ENCOUNTER — Ambulatory Visit (HOSPITAL_COMMUNITY)
Admission: RE | Admit: 2019-05-19 | Discharge: 2019-05-19 | Disposition: A | Discharge status: Home / Self Care | Payer: Medicare Other | Source: Ambulatory Visit | Attending: Cardiology | Admitting: Cardiology

## 2019-05-19 ENCOUNTER — Other Ambulatory Visit: Payer: Self-pay

## 2019-05-19 DIAGNOSIS — R079 Chest pain, unspecified: Secondary | ICD-10-CM | POA: Diagnosis not present

## 2019-05-19 LAB — NM MYOCAR MULTI W/SPECT W/WALL MOTION / EF
LV dias vol: 59 mL (ref 46–106)
LV sys vol: 17 mL
Peak HR: 102 {beats}/min
RATE: 0.63
Rest HR: 67 {beats}/min
SDS: 2
SRS: 6
SSS: 8
TID: 1.05

## 2019-05-19 MED ORDER — TECHNETIUM TC 99M TETROFOSMIN IV KIT
30.0000 | PACK | Freq: Once | INTRAVENOUS | Status: AC | PRN
  Administered 2019-05-19: 33 via INTRAVENOUS

## 2019-05-19 MED ORDER — REGADENOSON 0.4 MG/5ML IV SOLN
INTRAVENOUS | Status: AC
  Administered 2019-05-19: 0.4 mg via INTRAVENOUS
  Filled 2019-05-19: qty 5

## 2019-05-19 MED ORDER — SODIUM CHLORIDE FLUSH 0.9 % IV SOLN
INTRAVENOUS | Status: AC
  Administered 2019-05-19: 10 mL via INTRAVENOUS
  Filled 2019-05-19: qty 10

## 2019-05-19 MED ORDER — TECHNETIUM TC 99M TETROFOSMIN IV KIT
10.0000 | PACK | Freq: Once | INTRAVENOUS | Status: AC | PRN
  Administered 2019-05-19: 11 via INTRAVENOUS

## 2019-05-23 ENCOUNTER — Telehealth: Payer: Self-pay | Admitting: *Deleted

## 2019-05-23 DIAGNOSIS — H401131 Primary open-angle glaucoma, bilateral, mild stage: Secondary | ICD-10-CM | POA: Diagnosis not present

## 2019-05-23 DIAGNOSIS — R0602 Shortness of breath: Secondary | ICD-10-CM

## 2019-05-23 NOTE — Telephone Encounter (Signed)
Pt voiced understanding and agreeable to echo - wants to wait 2 weeks before appt - has upcoming appts already planned. Orders placed and will forward to schedulers

## 2019-05-23 NOTE — Telephone Encounter (Signed)
-----   Message from Arnoldo Lenis, MD sent at 05/23/2019  9:53 AM EDT ----- Stress test does not show any signs of a current blockage, some evidence of some prior old blockages which are no longer active. Can she get an echo to further evaluate her heart pumping function and any old prior damage, indication for SOB  Zandra Abts MD

## 2019-06-06 DIAGNOSIS — J302 Other seasonal allergic rhinitis: Secondary | ICD-10-CM | POA: Diagnosis not present

## 2019-06-15 ENCOUNTER — Ambulatory Visit (HOSPITAL_COMMUNITY)
Admission: RE | Admit: 2019-06-15 | Discharge: 2019-06-15 | Disposition: A | Discharge status: Home / Self Care | Payer: Medicare Other | Source: Ambulatory Visit | Attending: Cardiology | Admitting: Cardiology

## 2019-06-15 ENCOUNTER — Other Ambulatory Visit: Payer: Self-pay

## 2019-06-15 DIAGNOSIS — R0602 Shortness of breath: Secondary | ICD-10-CM

## 2019-06-15 NOTE — Progress Notes (Signed)
  Echocardiogram 2D Echocardiogram has been performed.  Matilde Bash 06/15/2019, 10:14 AM

## 2019-06-26 ENCOUNTER — Telehealth: Payer: Self-pay | Admitting: *Deleted

## 2019-06-26 NOTE — Telephone Encounter (Signed)
-----   Message from Arnoldo Lenis, MD sent at 06/26/2019  1:54 PM EDT ----- Echo looks good, normal heart pumping function. No signs of prior damage or weakness/ F/u 4 months   Zandra Abts MD

## 2019-06-30 NOTE — Telephone Encounter (Signed)
Pt voiced understanding - 4 month f/u scheduled  

## 2019-07-25 DIAGNOSIS — E1129 Type 2 diabetes mellitus with other diabetic kidney complication: Secondary | ICD-10-CM | POA: Diagnosis not present

## 2019-07-25 DIAGNOSIS — Z79899 Other long term (current) drug therapy: Secondary | ICD-10-CM | POA: Diagnosis not present

## 2019-07-25 DIAGNOSIS — E785 Hyperlipidemia, unspecified: Secondary | ICD-10-CM | POA: Diagnosis not present

## 2019-07-25 DIAGNOSIS — N183 Chronic kidney disease, stage 3 unspecified: Secondary | ICD-10-CM | POA: Diagnosis not present

## 2019-07-28 DIAGNOSIS — E785 Hyperlipidemia, unspecified: Secondary | ICD-10-CM | POA: Diagnosis not present

## 2019-07-28 DIAGNOSIS — N1831 Chronic kidney disease, stage 3a: Secondary | ICD-10-CM | POA: Diagnosis not present

## 2019-07-28 DIAGNOSIS — R079 Chest pain, unspecified: Secondary | ICD-10-CM | POA: Diagnosis not present

## 2019-07-28 DIAGNOSIS — E1122 Type 2 diabetes mellitus with diabetic chronic kidney disease: Secondary | ICD-10-CM | POA: Diagnosis not present

## 2019-10-26 ENCOUNTER — Ambulatory Visit: Payer: Medicare Other | Admitting: Cardiology

## 2019-11-22 DIAGNOSIS — E539 Vitamin B deficiency, unspecified: Secondary | ICD-10-CM | POA: Diagnosis not present

## 2019-11-22 DIAGNOSIS — N183 Chronic kidney disease, stage 3 unspecified: Secondary | ICD-10-CM | POA: Diagnosis not present

## 2019-11-22 DIAGNOSIS — E785 Hyperlipidemia, unspecified: Secondary | ICD-10-CM | POA: Diagnosis not present

## 2019-11-22 DIAGNOSIS — E1129 Type 2 diabetes mellitus with other diabetic kidney complication: Secondary | ICD-10-CM | POA: Diagnosis not present

## 2019-11-22 DIAGNOSIS — Z79899 Other long term (current) drug therapy: Secondary | ICD-10-CM | POA: Diagnosis not present

## 2019-11-28 DIAGNOSIS — Z23 Encounter for immunization: Secondary | ICD-10-CM | POA: Diagnosis not present

## 2019-11-28 DIAGNOSIS — N1831 Chronic kidney disease, stage 3a: Secondary | ICD-10-CM | POA: Diagnosis not present

## 2019-11-28 DIAGNOSIS — J310 Chronic rhinitis: Secondary | ICD-10-CM | POA: Diagnosis not present

## 2019-11-28 DIAGNOSIS — R7309 Other abnormal glucose: Secondary | ICD-10-CM | POA: Diagnosis not present

## 2019-11-28 DIAGNOSIS — E1122 Type 2 diabetes mellitus with diabetic chronic kidney disease: Secondary | ICD-10-CM | POA: Diagnosis not present

## 2019-12-05 ENCOUNTER — Other Ambulatory Visit (HOSPITAL_COMMUNITY): Payer: Self-pay | Admitting: Internal Medicine

## 2019-12-05 DIAGNOSIS — Z1231 Encounter for screening mammogram for malignant neoplasm of breast: Secondary | ICD-10-CM

## 2020-01-08 ENCOUNTER — Ambulatory Visit (HOSPITAL_COMMUNITY)
Admission: RE | Admit: 2020-01-08 | Discharge: 2020-01-08 | Disposition: A | Discharge status: Home / Self Care | Payer: Medicare Other | Source: Ambulatory Visit | Attending: Internal Medicine | Admitting: Internal Medicine

## 2020-01-08 ENCOUNTER — Other Ambulatory Visit: Payer: Self-pay

## 2020-01-08 DIAGNOSIS — Z1231 Encounter for screening mammogram for malignant neoplasm of breast: Secondary | ICD-10-CM | POA: Insufficient documentation

## 2020-01-31 DIAGNOSIS — Z0000 Encounter for general adult medical examination without abnormal findings: Secondary | ICD-10-CM | POA: Diagnosis not present

## 2020-02-02 DIAGNOSIS — H401131 Primary open-angle glaucoma, bilateral, mild stage: Secondary | ICD-10-CM | POA: Diagnosis not present

## 2020-02-07 DIAGNOSIS — E119 Type 2 diabetes mellitus without complications: Secondary | ICD-10-CM | POA: Diagnosis not present

## 2020-03-21 DIAGNOSIS — I10 Essential (primary) hypertension: Secondary | ICD-10-CM | POA: Diagnosis not present

## 2020-03-21 DIAGNOSIS — Z79899 Other long term (current) drug therapy: Secondary | ICD-10-CM | POA: Diagnosis not present

## 2020-03-21 DIAGNOSIS — D519 Vitamin B12 deficiency anemia, unspecified: Secondary | ICD-10-CM | POA: Diagnosis not present

## 2020-03-21 DIAGNOSIS — Z1159 Encounter for screening for other viral diseases: Secondary | ICD-10-CM | POA: Diagnosis not present

## 2020-03-21 DIAGNOSIS — E1129 Type 2 diabetes mellitus with other diabetic kidney complication: Secondary | ICD-10-CM | POA: Diagnosis not present

## 2020-03-21 DIAGNOSIS — N183 Chronic kidney disease, stage 3 unspecified: Secondary | ICD-10-CM | POA: Diagnosis not present

## 2020-03-28 DIAGNOSIS — R7309 Other abnormal glucose: Secondary | ICD-10-CM | POA: Diagnosis not present

## 2020-03-28 DIAGNOSIS — E1122 Type 2 diabetes mellitus with diabetic chronic kidney disease: Secondary | ICD-10-CM | POA: Diagnosis not present

## 2020-03-28 DIAGNOSIS — I10 Essential (primary) hypertension: Secondary | ICD-10-CM | POA: Diagnosis not present

## 2020-03-28 DIAGNOSIS — N1831 Chronic kidney disease, stage 3a: Secondary | ICD-10-CM | POA: Diagnosis not present

## 2020-06-06 DIAGNOSIS — K5792 Diverticulitis of intestine, part unspecified, without perforation or abscess without bleeding: Secondary | ICD-10-CM | POA: Diagnosis not present

## 2020-07-18 DIAGNOSIS — E785 Hyperlipidemia, unspecified: Secondary | ICD-10-CM | POA: Diagnosis not present

## 2020-07-18 DIAGNOSIS — E119 Type 2 diabetes mellitus without complications: Secondary | ICD-10-CM | POA: Diagnosis not present

## 2020-07-24 DIAGNOSIS — I10 Essential (primary) hypertension: Secondary | ICD-10-CM | POA: Diagnosis not present

## 2020-07-24 DIAGNOSIS — N183 Chronic kidney disease, stage 3 unspecified: Secondary | ICD-10-CM | POA: Diagnosis not present

## 2020-07-24 DIAGNOSIS — R809 Proteinuria, unspecified: Secondary | ICD-10-CM | POA: Diagnosis not present

## 2020-07-24 DIAGNOSIS — E1129 Type 2 diabetes mellitus with other diabetic kidney complication: Secondary | ICD-10-CM | POA: Diagnosis not present

## 2020-07-24 DIAGNOSIS — Z79899 Other long term (current) drug therapy: Secondary | ICD-10-CM | POA: Diagnosis not present

## 2020-07-29 DIAGNOSIS — R7309 Other abnormal glucose: Secondary | ICD-10-CM | POA: Diagnosis not present

## 2020-07-29 DIAGNOSIS — E785 Hyperlipidemia, unspecified: Secondary | ICD-10-CM | POA: Diagnosis not present

## 2020-07-29 DIAGNOSIS — N1831 Chronic kidney disease, stage 3a: Secondary | ICD-10-CM | POA: Diagnosis not present

## 2020-07-29 DIAGNOSIS — E1122 Type 2 diabetes mellitus with diabetic chronic kidney disease: Secondary | ICD-10-CM | POA: Diagnosis not present

## 2020-08-29 ENCOUNTER — Other Ambulatory Visit: Payer: Self-pay

## 2020-08-29 ENCOUNTER — Ambulatory Visit: Payer: Medicare Other | Admitting: Podiatry

## 2020-08-29 DIAGNOSIS — M2012 Hallux valgus (acquired), left foot: Secondary | ICD-10-CM | POA: Diagnosis not present

## 2020-08-29 DIAGNOSIS — M21612 Bunion of left foot: Secondary | ICD-10-CM

## 2020-08-29 DIAGNOSIS — L84 Corns and callosities: Secondary | ICD-10-CM | POA: Diagnosis not present

## 2020-08-29 DIAGNOSIS — E119 Type 2 diabetes mellitus without complications: Secondary | ICD-10-CM

## 2020-08-29 DIAGNOSIS — M2041 Other hammer toe(s) (acquired), right foot: Secondary | ICD-10-CM | POA: Diagnosis not present

## 2020-08-29 DIAGNOSIS — M21611 Bunion of right foot: Secondary | ICD-10-CM | POA: Diagnosis not present

## 2020-08-29 DIAGNOSIS — M2011 Hallux valgus (acquired), right foot: Secondary | ICD-10-CM

## 2020-08-29 DIAGNOSIS — M2042 Other hammer toe(s) (acquired), left foot: Secondary | ICD-10-CM | POA: Diagnosis not present

## 2020-08-29 NOTE — Patient Instructions (Signed)
Look for urea 40% cream or ointment and apply to the thickened dry skin / calluses. This can be bought over the counter, at a pharmacy or online such as [employer].  

## 2020-09-02 ENCOUNTER — Encounter: Payer: Self-pay | Admitting: Podiatry

## 2020-09-02 NOTE — Progress Notes (Signed)
  Subjective:  Patient ID: Amy Rojas, female    DOB: [birth year],  MRN: [id number]  Chief Complaint  Patient presents with   Callouses    (np) left foot corn-bil hard place;bilateral   Diabetes    80 y.o. female presents with the above complaint. History confirmed with patient.  She has hard calluses between her big toe and her second toe on the right foot and under the second and third toes of both feet.  She has difficulty keeping him comfortable and they are causing pain.  The toes rub together  Objective:  Physical Exam: warm, good capillary refill, no trophic changes or ulcerative lesions, normal DP and PT pulses, normal monofilament exam, and normal sensory exam. Left Foot: bunion deformity noted submetatarsal 2 3 callus Right Foot: bunion deformity noted and hammertoes noted interdigital corn first and second, submetatarsal 2 3 callus  Assessment:   1. Callus of foot   2. Type 2 diabetes mellitus treated without insulin (HCC)   3. Hallux valgus with bunions, right   4. Hallux valgus with bunions, left   5. Hammertoe of left foot   6. Hammertoe of right foot      Plan:  Patient was evaluated and treated and all questions answered.  All symptomatic hyperkeratoses were safely debrided with a sterile #15 blade to patient's level of comfort without incident. We discussed preventative and palliative care of these lesions including supportive and accommodative shoegear, padding, prefabricated and custom molded accommodative orthoses, use of a pumice stone and lotions/creams daily.  Discussed etiology of her calluses from her hammertoe deformities as well as hallux valgus causing pressure.  I dispensed silicone pads to offload these as well for her.  Discussed possible surgical correction if this does not improve which she would like to avoid and this is understandable.  Return to see as needed  No follow-ups on file.

## 2020-10-03 DIAGNOSIS — H401131 Primary open-angle glaucoma, bilateral, mild stage: Secondary | ICD-10-CM | POA: Diagnosis not present

## 2020-10-18 DIAGNOSIS — E119 Type 2 diabetes mellitus without complications: Secondary | ICD-10-CM | POA: Diagnosis not present

## 2020-10-18 DIAGNOSIS — E785 Hyperlipidemia, unspecified: Secondary | ICD-10-CM | POA: Diagnosis not present

## 2020-10-30 DIAGNOSIS — Z23 Encounter for immunization: Secondary | ICD-10-CM | POA: Diagnosis not present

## 2020-11-19 DIAGNOSIS — H903 Sensorineural hearing loss, bilateral: Secondary | ICD-10-CM | POA: Diagnosis not present

## 2020-11-19 DIAGNOSIS — H6983 Other specified disorders of Eustachian tube, bilateral: Secondary | ICD-10-CM | POA: Diagnosis not present

## 2020-11-26 DIAGNOSIS — E785 Hyperlipidemia, unspecified: Secondary | ICD-10-CM | POA: Diagnosis not present

## 2020-11-26 DIAGNOSIS — N1831 Chronic kidney disease, stage 3a: Secondary | ICD-10-CM | POA: Diagnosis not present

## 2020-11-26 DIAGNOSIS — Z79899 Other long term (current) drug therapy: Secondary | ICD-10-CM | POA: Diagnosis not present

## 2020-11-26 DIAGNOSIS — I10 Essential (primary) hypertension: Secondary | ICD-10-CM | POA: Diagnosis not present

## 2020-11-26 DIAGNOSIS — E1129 Type 2 diabetes mellitus with other diabetic kidney complication: Secondary | ICD-10-CM | POA: Diagnosis not present

## 2020-11-27 DIAGNOSIS — H6993 Unspecified Eustachian tube disorder, bilateral: Secondary | ICD-10-CM | POA: Diagnosis not present

## 2020-12-10 DIAGNOSIS — N390 Urinary tract infection, site not specified: Secondary | ICD-10-CM | POA: Diagnosis not present

## 2020-12-10 DIAGNOSIS — I10 Essential (primary) hypertension: Secondary | ICD-10-CM | POA: Diagnosis not present

## 2020-12-10 DIAGNOSIS — R7309 Other abnormal glucose: Secondary | ICD-10-CM | POA: Diagnosis not present

## 2020-12-10 DIAGNOSIS — E1122 Type 2 diabetes mellitus with diabetic chronic kidney disease: Secondary | ICD-10-CM | POA: Diagnosis not present

## 2020-12-23 DIAGNOSIS — E119 Type 2 diabetes mellitus without complications: Secondary | ICD-10-CM | POA: Diagnosis not present

## 2021-02-03 DIAGNOSIS — E119 Type 2 diabetes mellitus without complications: Secondary | ICD-10-CM | POA: Diagnosis not present

## 2021-02-04 ENCOUNTER — Other Ambulatory Visit (HOSPITAL_COMMUNITY): Payer: Self-pay | Admitting: Internal Medicine

## 2021-02-04 DIAGNOSIS — Z1231 Encounter for screening mammogram for malignant neoplasm of breast: Secondary | ICD-10-CM

## 2021-02-13 ENCOUNTER — Ambulatory Visit (HOSPITAL_COMMUNITY): Payer: Medicare Other

## 2021-02-14 ENCOUNTER — Other Ambulatory Visit: Payer: Self-pay

## 2021-02-14 ENCOUNTER — Ambulatory Visit (HOSPITAL_COMMUNITY)
Admission: RE | Admit: 2021-02-14 | Discharge: 2021-02-14 | Disposition: A | Discharge status: Home / Self Care | Payer: Medicare Other | Source: Ambulatory Visit | Attending: Internal Medicine | Admitting: Internal Medicine

## 2021-02-14 DIAGNOSIS — Z1231 Encounter for screening mammogram for malignant neoplasm of breast: Secondary | ICD-10-CM | POA: Diagnosis not present

## 2021-02-16 DIAGNOSIS — E119 Type 2 diabetes mellitus without complications: Secondary | ICD-10-CM | POA: Diagnosis not present

## 2021-02-16 DIAGNOSIS — E785 Hyperlipidemia, unspecified: Secondary | ICD-10-CM | POA: Diagnosis not present

## 2021-02-17 ENCOUNTER — Other Ambulatory Visit (HOSPITAL_COMMUNITY): Payer: Self-pay | Admitting: Internal Medicine

## 2021-02-17 DIAGNOSIS — R928 Other abnormal and inconclusive findings on diagnostic imaging of breast: Secondary | ICD-10-CM

## 2021-02-17 DIAGNOSIS — N6489 Other specified disorders of breast: Secondary | ICD-10-CM | POA: Diagnosis not present

## 2021-02-17 DIAGNOSIS — R922 Inconclusive mammogram: Secondary | ICD-10-CM | POA: Diagnosis not present

## 2021-02-18 ENCOUNTER — Encounter (HOSPITAL_COMMUNITY): Payer: Medicare Other

## 2021-02-18 ENCOUNTER — Ambulatory Visit (HOSPITAL_COMMUNITY): Payer: Medicare Other

## 2021-02-20 ENCOUNTER — Ambulatory Visit (HOSPITAL_COMMUNITY)
Admission: RE | Admit: 2021-02-20 | Discharge: 2021-02-20 | Disposition: A | Discharge status: Home / Self Care | Payer: Medicare Other | Source: Ambulatory Visit | Attending: Internal Medicine | Admitting: Internal Medicine

## 2021-02-20 ENCOUNTER — Other Ambulatory Visit: Payer: Self-pay

## 2021-02-20 DIAGNOSIS — R922 Inconclusive mammogram: Secondary | ICD-10-CM | POA: Diagnosis not present

## 2021-02-20 DIAGNOSIS — N6489 Other specified disorders of breast: Secondary | ICD-10-CM | POA: Diagnosis not present

## 2021-02-20 DIAGNOSIS — R928 Other abnormal and inconclusive findings on diagnostic imaging of breast: Secondary | ICD-10-CM | POA: Insufficient documentation

## 2021-04-04 DIAGNOSIS — Z79899 Other long term (current) drug therapy: Secondary | ICD-10-CM | POA: Diagnosis not present

## 2021-04-04 DIAGNOSIS — I10 Essential (primary) hypertension: Secondary | ICD-10-CM | POA: Diagnosis not present

## 2021-04-04 DIAGNOSIS — E1129 Type 2 diabetes mellitus with other diabetic kidney complication: Secondary | ICD-10-CM | POA: Diagnosis not present

## 2021-04-04 DIAGNOSIS — N1831 Chronic kidney disease, stage 3a: Secondary | ICD-10-CM | POA: Diagnosis not present

## 2021-04-08 DIAGNOSIS — N183 Chronic kidney disease, stage 3 unspecified: Secondary | ICD-10-CM | POA: Diagnosis not present

## 2021-04-08 DIAGNOSIS — R7309 Other abnormal glucose: Secondary | ICD-10-CM | POA: Diagnosis not present

## 2021-04-08 DIAGNOSIS — E119 Type 2 diabetes mellitus without complications: Secondary | ICD-10-CM | POA: Diagnosis not present

## 2021-04-08 DIAGNOSIS — I10 Essential (primary) hypertension: Secondary | ICD-10-CM | POA: Diagnosis not present

## 2021-05-03 ENCOUNTER — Telehealth: Payer: Medicare Other | Admitting: Family Medicine

## 2021-05-03 DIAGNOSIS — N3000 Acute cystitis without hematuria: Secondary | ICD-10-CM | POA: Diagnosis not present

## 2021-05-03 MED ORDER — NITROFURANTOIN MONOHYD MACRO 100 MG PO CAPS: 100.0000 mg | ORAL_CAPSULE | Freq: Two times a day (BID) | ORAL | 0 refills | Status: AC

## 2021-05-03 NOTE — Progress Notes (Signed)
?Virtual Visit Consent  ? ?Amy Rojas, you are scheduled for a virtual visit with a [facility] provider today.   ?  ?Just as with appointments in the office, your consent must be obtained to participate.  Your consent will be active for this visit and any virtual visit you may have with one of our providers in the next 365 days.   ?  ?If you have a MyChart account, a copy of this consent can be sent to you electronically.  All virtual visits are billed to your insurance company just like a traditional visit in the office.   ? ?As this is a virtual visit, video technology does not allow for your provider to perform a traditional examination.  This may limit your provider's ability to fully assess your condition.  If your provider identifies any concerns that need to be evaluated in person or the need to arrange testing (such as labs, EKG, etc.), we will make arrangements to do so.   ?  ?Although advances in technology are sophisticated, we cannot ensure that it will always work on either your end or our end.  If the connection with a video visit is poor, the visit may have to be switched to a telephone visit.  With either a video or telephone visit, we are not always able to ensure that we have a secure connection.    ? ?I need to obtain your verbal consent now.   Are you willing to proceed with your visit today?  ?  ?Amy Rojas has provided verbal consent on 05/03/2021 for a virtual visit (video or telephone). ?  ?Amy Nims, FNP  ? ?Date: 05/03/2021 10:09 AM ? ? ?Virtual Visit via Video Note  ? ?IDellia Rojas, connected with  Amy Rojas  ([id number], 16-Feb-1940) on 05/03/21 at 10:15 AM EDT by a video-enabled telemedicine application and verified that I am speaking with the correct person using two identifiers. ? ?Location: ?Patient: Virtual Visit Location Patient: Home ?Provider: Virtual Visit Location Provider: Home Office ?  ?I discussed the limitations of evaluation and management by telemedicine  and the availability of in person appointments. The patient expressed understanding and agreed to proceed.   ? ?History of Present Illness: ?Amy Rojas is a 81 y.o. who identifies as a female who was assigned female at birth, and is being seen today for burning and frequency on urination. No fever. No abd pain. She is requesting macrobid reporting this has worked for her in the past. She has an episode in august of last year and jan of this year. . ? ?HPI: HPI  ?Problems:  ?Patient Active Problem List  ? Diagnosis Date Noted  ? Family hx of colon cancer 07/13/2017  ? Polyp of colon 07/13/2017  ? Midline cystocele 07/29/2016  ? Uterine prolapse 07/29/2016  ? Left bundle branch block 05/15/2013  ? Type 2 diabetes mellitus treated without insulin ([place]) 05/12/2013  ? Other and unspecified hyperlipidemia 05/12/2013  ? Proteinuria 05/12/2013  ? Renal failure, unspecified 05/12/2013  ? Mallet finger of right hand 12/24/2011  ?  ?Allergies:  ?Allergies  ?Allergen Reactions  ? Sulfa Antibiotics Other (See Comments)  ?  Cystitis as a child  ? ?Medications:  ?Current Outpatient Medications:  ?  aspirin 81 MG EC tablet, Take by mouth., Disp: , Rfl:  ?  aspirin 81 MG tablet, Take 81 mg by mouth daily., Disp: , Rfl:  ?  atorvastatin (LIPITOR) 20 MG tablet, Take 20 mg  by mouth daily., Disp: , Rfl:  ?  atorvastatin (LIPITOR) 20 MG tablet, Take by mouth., Disp: , Rfl:  ?  cholecalciferol (VITAMIN D3) 25 MCG (1000 UNIT) tablet, Take 1,000 Units by mouth daily., Disp: , Rfl:  ?  latanoprost (XALATAN) 0.005 % ophthalmic solution, Place 1 drop into both eyes at bedtime., Disp: , Rfl:  ?  latanoprost (XALATAN) 0.005 % ophthalmic solution, Place 1 drop into both eyes nightly., Disp: , Rfl:  ?  levocetirizine (XYZAL) 5 MG tablet, Take 5 mg by mouth daily., Disp: , Rfl:  ?  losartan (COZAAR) 100 MG tablet, Take 100 mg by mouth daily., Disp: , Rfl:  ?  metFORMIN (GLUCOPHAGE) 500 MG tablet, Take 500 mg by mouth 2 (two) times daily with a  meal. , Disp: , Rfl:  ?  metFORMIN (GLUCOPHAGE) 500 MG tablet, Take by mouth., Disp: , Rfl:  ?  vitamin B-12 (CYANOCOBALAMIN) 100 MCG tablet, Take 100 mcg by mouth daily., Disp: , Rfl:  ? ?Observations/Objective: ?Patient is well-developed, well-nourished in no acute distress.  ?Resting comfortably  at home.  ?Head is normocephalic, atraumatic.  ?No labored breathing.  ?Speech is clear and coherent with logical content.  ?Patient is alert and oriented at baseline.  ? ? ?Assessment and Plan: ?1. Acute cystitis without hematuria ? ?Increase fluids, primarily water, med use and side effects discussed, f.u with pcp as needed. Preventative measures discussed.  ? ?Follow Up Instructions: ?I discussed the assessment and treatment plan with the patient. The patient was provided an opportunity to ask questions and all were answered. The patient agreed with the plan and demonstrated an understanding of the instructions.  A copy of instructions were sent to the patient via MyChart unless otherwise noted below.  ? ? ? ?The patient was advised to call back or seek an in-person evaluation if the symptoms worsen or if the condition fails to improve as anticipated. ? ?Time:  ?I spent 10 minutes with the patient via telehealth technology discussing the above problems/concerns.   ? ?Amy Nims, FNP  ?

## 2021-05-03 NOTE — Patient Instructions (Signed)
Urinary Tract Infection, Adult  A urinary tract infection (UTI) is an infection of any part of the urinary tract. The urinary tract includes the kidneys, ureters, bladder, and urethra. These organs make, store, and get rid of urine in the body. An upper UTI affects the ureters and kidneys. A lower UTI affects the bladder and urethra. What are the causes? Most urinary tract infections are caused by bacteria in your genital area around your urethra, where urine leaves your body. These bacteria grow and cause inflammation of your urinary tract. What increases the risk? You are more likely to develop this condition if: You have a urinary catheter that stays in place. You are not able to control when you urinate or have a bowel movement (incontinence). You are female and you: Use a spermicide or diaphragm for birth control. Have low estrogen levels. Are pregnant. You have certain genes that increase your risk. You are sexually active. You take antibiotic medicines. You have a condition that causes your flow of urine to slow down, such as: An enlarged prostate, if you are female. Blockage in your urethra. A kidney stone. A nerve condition that affects your bladder control (neurogenic bladder). Not getting enough to drink, or not urinating often. You have certain medical conditions, such as: Diabetes. A weak disease-fighting system (immunesystem). Sickle cell disease. Gout. Spinal cord injury. What are the signs or symptoms? Symptoms of this condition include: Needing to urinate right away (urgency). Frequent urination. This may include small amounts of urine each time you urinate. Pain or burning with urination. Blood in the urine. Urine that smells bad or unusual. Trouble urinating. Cloudy urine. Vaginal discharge, if you are female. Pain in the abdomen or the lower back. You may also have: Vomiting or a decreased appetite. Confusion. Irritability or tiredness. A fever or  chills. Diarrhea. The first symptom in older adults may be confusion. In some cases, they may not have any symptoms until the infection has worsened. How is this diagnosed? This condition is diagnosed based on your medical history and a physical exam. You may also have other tests, including: Urine tests. Blood tests. Tests for STIs (sexually transmitted infections). If you have had more than one UTI, a cystoscopy or imaging studies may be done to determine the cause of the infections. How is this treated? Treatment for this condition includes: Antibiotic medicine. Over-the-counter medicines to treat discomfort. Drinking enough water to stay hydrated. If you have frequent infections or have other conditions such as a kidney stone, you may need to see a health care provider who specializes in the urinary tract (urologist). In rare cases, urinary tract infections can cause sepsis. Sepsis is a life-threatening condition that occurs when the body responds to an infection. Sepsis is treated in the hospital with IV antibiotics, fluids, and other medicines. Follow these instructions at home:  Medicines Take over-the-counter and prescription medicines only as told by your health care provider. If you were prescribed an antibiotic medicine, take it as told by your health care provider. Do not stop using the antibiotic even if you start to feel better. General instructions Make sure you: Empty your bladder often and completely. Do not hold urine for long periods of time. Empty your bladder after sex. Wipe from front to back after urinating or having a bowel movement if you are female. Use each tissue only one time when you wipe. Drink enough fluid to keep your urine pale yellow. Keep all follow-up visits. This is important. Contact a health   care provider if: Your symptoms do not get better after 1-2 days. Your symptoms go away and then return. Get help right away if: You have severe pain in  your back or your lower abdomen. You have a fever or chills. You have nausea or vomiting. Summary A urinary tract infection (UTI) is an infection of any part of the urinary tract, which includes the kidneys, ureters, bladder, and urethra. Most urinary tract infections are caused by bacteria in your genital area. Treatment for this condition often includes antibiotic medicines. If you were prescribed an antibiotic medicine, take it as told by your health care provider. Do not stop using the antibiotic even if you start to feel better. Keep all follow-up visits. This is important. This information is not intended to replace advice given to you by your health care provider. Make sure you discuss any questions you have with your health care provider. Document Revised: 08/18/2019 Document Reviewed: 08/18/2019 Elsevier Patient Education  2023 Elsevier Inc.  

## 2021-08-05 DIAGNOSIS — Z79899 Other long term (current) drug therapy: Secondary | ICD-10-CM | POA: Diagnosis not present

## 2021-08-05 DIAGNOSIS — E1129 Type 2 diabetes mellitus with other diabetic kidney complication: Secondary | ICD-10-CM | POA: Diagnosis not present

## 2021-08-05 DIAGNOSIS — N1831 Chronic kidney disease, stage 3a: Secondary | ICD-10-CM | POA: Diagnosis not present

## 2021-08-05 DIAGNOSIS — I10 Essential (primary) hypertension: Secondary | ICD-10-CM | POA: Diagnosis not present

## 2021-08-21 DIAGNOSIS — N183 Chronic kidney disease, stage 3 unspecified: Secondary | ICD-10-CM | POA: Diagnosis not present

## 2021-08-21 DIAGNOSIS — E119 Type 2 diabetes mellitus without complications: Secondary | ICD-10-CM | POA: Diagnosis not present

## 2021-08-21 DIAGNOSIS — I10 Essential (primary) hypertension: Secondary | ICD-10-CM | POA: Diagnosis not present

## 2021-08-21 DIAGNOSIS — R739 Hyperglycemia, unspecified: Secondary | ICD-10-CM | POA: Diagnosis not present

## 2021-09-10 ENCOUNTER — Other Ambulatory Visit (HOSPITAL_COMMUNITY): Payer: Self-pay | Admitting: Internal Medicine

## 2021-09-10 ENCOUNTER — Other Ambulatory Visit: Payer: Self-pay | Admitting: Internal Medicine

## 2021-09-10 DIAGNOSIS — R7989 Other specified abnormal findings of blood chemistry: Secondary | ICD-10-CM

## 2021-09-12 DIAGNOSIS — H401131 Primary open-angle glaucoma, bilateral, mild stage: Secondary | ICD-10-CM | POA: Diagnosis not present

## 2021-09-19 ENCOUNTER — Ambulatory Visit (HOSPITAL_COMMUNITY)
Admission: RE | Admit: 2021-09-19 | Discharge: 2021-09-19 | Disposition: A | Discharge status: Home / Self Care | Payer: Medicare Other | Source: Ambulatory Visit | Attending: Internal Medicine | Admitting: Internal Medicine

## 2021-09-19 DIAGNOSIS — N1330 Unspecified hydronephrosis: Secondary | ICD-10-CM | POA: Insufficient documentation

## 2021-09-19 DIAGNOSIS — R7989 Other specified abnormal findings of blood chemistry: Secondary | ICD-10-CM | POA: Diagnosis not present

## 2021-09-30 ENCOUNTER — Encounter: Payer: Self-pay | Admitting: Urology

## 2021-09-30 ENCOUNTER — Ambulatory Visit: Payer: Medicare Other | Admitting: Urology

## 2021-09-30 VITALS — BP 175/78 | HR 79 | Ht 65.0 in | Wt 129.0 lb

## 2021-09-30 DIAGNOSIS — N8110 Cystocele, unspecified: Secondary | ICD-10-CM | POA: Diagnosis not present

## 2021-09-30 DIAGNOSIS — Z87440 Personal history of urinary (tract) infections: Secondary | ICD-10-CM

## 2021-09-30 DIAGNOSIS — N1330 Unspecified hydronephrosis: Secondary | ICD-10-CM

## 2021-09-30 LAB — URINALYSIS, ROUTINE W REFLEX MICROSCOPIC
Bilirubin, UA: NEGATIVE
Glucose, UA: NEGATIVE
Ketones, UA: NEGATIVE
Leukocytes,UA: NEGATIVE
Nitrite, UA: NEGATIVE
Protein,UA: NEGATIVE
RBC, UA: NEGATIVE
Specific Gravity, UA: <1.005 — ABNORMAL LOW (ref 1.005–1.030)
Urobilinogen, Ur: 0.2 mg/dL (ref 0.2–1.0)
pH, UA: 5.5 (ref 5.0–7.5)

## 2021-09-30 LAB — BLADDER SCAN AMB NON-IMAGING
Scan Result: 176
Scan Result: 367

## 2021-09-30 NOTE — Progress Notes (Signed)
Assessment: 1. Hydronephrosis of right kidney   2. History of UTI   3. Vaginal wall prolapse     Plan: Patient records from Dr. Willey Blade reviewed. I reviewed the recent renal ultrasound results showing right hydronephrosis. Recommend further evaluation with a CT renal stone protocol. Her hydronephrosis may be secondary to reflux or incomplete emptying secondary to bladder prolapse. Methods to reduce the risk of UTI's discussed including increased fluid intake, timed and double voiding, daily cranberry supplement, and daily probiotics.  I also discussed the potential benefit of vaginal hormone cream. Schedule for CT renal stone study. Return to office after CT done to discuss results. Recommend follow-up with Dr. Zigmund Daniel if patient is interested in additional treatment for her vaginal wall prolapse.  Chief Complaint:  Chief Complaint  Patient presents with   Hydronephrosis    History of Present Illness:  Amy Rojas is a 81 y.o. female who is seen in consultation from Asencion Noble, MD for evaluation of right hydronephrosis.  She was recently found to have a slight elevation in her creatinine to 1.23. Renal ultrasound from 09/19/2021 showed moderate hydronephrosis of the right kidney increased with a voiding, postvoid residual of 90 mL. No flank pain.  No recent dysuria or gross hematuria.  No history of kidney stones. She has a history of frequent UTIs going back to childhood.  Typical symptoms include frequency, urgency, and dysuria.  No fevers or chills.  Her last UTI symptoms were in May 2023.  She was treated with Macrodantin.  No urine culture results available.  She was previously seen by Dr. [place] Pink in 2018 for evaluation of vaginal wall prolapse.  She was diagnosed with uterine prolapse and a cystocele.  Options for management including surgical management with Sacrospinous hysteropexy, A&P repair, and possible sling discussed.  She elected for observation.  She  has noted some worsening of the vaginal prolapse.  Past Medical History:  Past Medical History:  Diagnosis Date   Borderline diabetic    Diverticulitis    Diverticulosis    High cholesterol     Past Surgical History:  Past Surgical History:  Procedure Laterality Date   cataracts     CHOLECYSTECTOMY     COLONOSCOPY N/A 12/09/2017   Procedure: COLONOSCOPY;  Surgeon: Rogene Houston, MD;  Location: [facility];  Service: Endoscopy;  Laterality: N/A;  1200   EYE SURGERY     High cholesterol     lazy eye surgery     x 2 over the years    Allergies:  Allergies  Allergen Reactions   Sulfa Antibiotics Other (See Comments)    Cystitis as a child    Family History:  Family History  Problem Relation Age of Onset   Arthritis Other    Diabetes Other    Kidney disease Other    Colon cancer Maternal Uncle    Heart failure Mother    Rheum arthritis Father     Social History:  Social History   Tobacco Use   Smoking status: Never   Smokeless tobacco: Never  Vaping Use   Vaping Use: Never used  Substance Use Topics   Alcohol use: No   Drug use: No    Review of symptoms:  Constitutional:  Negative for unexplained weight loss, night sweats, fever, chills ENT:  Negative for nose bleeds, sinus pain, painful swallowing CV:  Negative for chest pain, shortness of breath, exercise intolerance, palpitations, loss of consciousness Resp:  Negative for cough, wheezing, shortness  of breath GI:  Negative for nausea, vomiting, diarrhea, bloody stools GU:  Positives noted in HPI; otherwise negative for gross hematuria, dysuria, urinary incontinence Neuro:  Negative for seizures, poor balance, limb weakness, slurred speech Psych:  Negative for lack of energy, depression, anxiety Endocrine:  Negative for polydipsia, polyuria, symptoms of hypoglycemia (dizziness, hunger, sweating) Hematologic:  Negative for anemia, purpura, petechia, prolonged or excessive bleeding, use of  anticoagulants  Allergic:  Negative for difficulty breathing or choking as a result of exposure to anything; no shellfish allergy; no allergic response (rash/itch) to materials, foods  Physical exam: BP (!) 175/78   Pulse 79   Ht '5\' 5"'$  (1.651 m)   Wt 129 lb (58.5 kg)   BMI 21.47 kg/m  GENERAL APPEARANCE:  Well appearing, well developed, well nourished, NAD HEENT: Atraumatic, Normocephalic, oropharynx clear. NECK: Supple without lymphadenopathy or thyromegaly. LUNGS: Clear to auscultation bilaterally. HEART: Regular Rate and Rhythm without murmurs, gallops, or rubs. ABDOMEN: Soft, non-tender, No Masses. EXTREMITIES: Moves all extremities well.  Without clubbing, cyanosis, or edema. NEUROLOGIC:  Alert and oriented x 3, normal gait, CN II-XII grossly intact.  MENTAL STATUS:  Appropriate. BACK:  Non-tender to palpation.  No CVAT SKIN:  Warm, dry and intact.    Results: U/A: dipstick negative   PVR:  176 ml

## 2021-09-30 NOTE — Progress Notes (Signed)
post void residual =329m  post void residual =1738mafter giving urine sample

## 2021-10-08 ENCOUNTER — Ambulatory Visit (HOSPITAL_COMMUNITY)
Admission: RE | Admit: 2021-10-08 | Discharge: 2021-10-08 | Disposition: A | Discharge status: Home / Self Care | Payer: Medicare Other | Source: Ambulatory Visit | Attending: Urology | Admitting: Urology

## 2021-10-08 DIAGNOSIS — N1330 Unspecified hydronephrosis: Secondary | ICD-10-CM | POA: Diagnosis not present

## 2021-10-08 DIAGNOSIS — N368 Other specified disorders of urethra: Secondary | ICD-10-CM | POA: Diagnosis not present

## 2021-10-08 DIAGNOSIS — K5730 Diverticulosis of large intestine without perforation or abscess without bleeding: Secondary | ICD-10-CM | POA: Diagnosis not present

## 2021-10-08 DIAGNOSIS — I700 Atherosclerosis of aorta: Secondary | ICD-10-CM | POA: Diagnosis not present

## 2021-10-20 ENCOUNTER — Ambulatory Visit
Admission: EM | Admit: 2021-10-20 | Discharge: 2021-10-20 | Disposition: A | Discharge status: Home / Self Care | Payer: Medicare Other | Attending: Nurse Practitioner | Admitting: Nurse Practitioner

## 2021-10-20 DIAGNOSIS — L247 Irritant contact dermatitis due to plants, except food: Secondary | ICD-10-CM | POA: Diagnosis not present

## 2021-10-20 MED ORDER — TRIAMCINOLONE ACETONIDE 0.1 % EX OINT: 1.0000 | TOPICAL_OINTMENT | Freq: Two times a day (BID) | CUTANEOUS | 0 refills | Status: AC

## 2021-10-20 NOTE — ED Provider Notes (Signed)
[facility]    CSN: [id number] Arrival date & time: 10/20/21  1111      History   Chief Complaint Chief Complaint  Patient presents with   Rash    HPI Amy Rojas is a 81 y.o. female.   Patient presents for rash to left upper arm, right forearm, and 1 spot on the right low back that began last Thursday after she was planting panties in her yard.  She reports the areas are little bit red and itchy.  They are starting to blister and looks like they are oozing some.  She denies fevers, nausea/vomiting.  No recent change in detergents, soaps, personal care products.  No shortness of breath, throat or tongue swelling, or new muscle pain or joint aches.  She has tried applying rubbing alcohol without relief.     Past Medical History:  Diagnosis Date   Borderline diabetic    Diverticulitis    Diverticulosis    High cholesterol     Patient Active Problem List   Diagnosis Date Noted   Hydronephrosis of right kidney 09/30/2021   History of UTI 09/30/2021   Vaginal wall prolapse 09/30/2021   Family hx of colon cancer 07/13/2017   Polyp of colon 07/13/2017   Midline cystocele 07/29/2016   Uterine prolapse 07/29/2016   Left bundle branch block 05/15/2013   Type 2 diabetes mellitus treated without insulin (English) 05/12/2013   Other and unspecified hyperlipidemia 05/12/2013   Proteinuria 05/12/2013   Renal failure, unspecified 05/12/2013   Mallet finger of right hand 12/24/2011    Past Surgical History:  Procedure Laterality Date   cataracts     CHOLECYSTECTOMY     COLONOSCOPY N/A 12/09/2017   Procedure: COLONOSCOPY;  Surgeon: Rogene Houston, MD;  Location: [facility];  Service: Endoscopy;  Laterality: N/A;  1200   EYE SURGERY     High cholesterol     lazy eye surgery     x 2 over the years    OB History   No obstetric history on file.      Home Medications    Prior to Admission medications   Medication Sig Start Date End Date Taking?  Authorizing Provider  triamcinolone ointment (KENALOG) 0.1 % Apply 1 Application topically 2 (two) times daily. Apply sparingly to affected areas twice daily as needed for itching. 10/20/21  Yes Eulogio Bear, NP  aspirin 81 MG tablet Take 81 mg by mouth daily.    [provider]  atorvastatin (LIPITOR) 20 MG tablet Take 20 mg by mouth daily.    [provider]  cholecalciferol (VITAMIN D3) 25 MCG (1000 UNIT) tablet Take 1,000 Units by mouth daily.    [provider]  cyanocobalamin (VITAMIN B12) 1000 MCG tablet Take 1,000 mcg by mouth daily.    [provider]  fluticasone (FLONASE) 50 MCG/ACT nasal spray Place into both nostrils daily.    [provider]  latanoprost (XALATAN) 0.005 % ophthalmic solution Place 1 drop into both eyes at bedtime.    [provider]  levocetirizine (XYZAL) 5 MG tablet Take 5 mg by mouth daily. 06/07/20   [provider]  losartan (COZAAR) 100 MG tablet Take 100 mg by mouth daily.    [provider]  metFORMIN (GLUCOPHAGE) 500 MG tablet Take 500 mg by mouth 2 (two) times daily with a meal.     [provider]    Family History Family History  Problem Relation Age of Onset  Arthritis Other    Diabetes Other    Kidney disease Other    Colon cancer Maternal Uncle    Heart failure Mother    Rheum arthritis Father     Social History Social History   Tobacco Use   Smoking status: Never   Smokeless tobacco: Never  Vaping Use   Vaping Use: Never used  Substance Use Topics   Alcohol use: No   Drug use: No     Allergies   Sulfa antibiotics   Review of Systems Review of Systems Per HPI  Physical Exam Triage Vital Signs ED Triage Vitals [10/20/21 1146]  Enc Vitals Group     BP      Pulse      Resp      Temp      Temp src      SpO2      Weight      Height      Head Circumference      Peak Flow      Pain Score 0     Pain Loc      Pain Edu?      Excl. in  [place]?    No data found.  Updated Vital Signs BP (!) 170/73   Pulse 68   Temp 98 F (36.7 C)   Resp 20   SpO2 95%   Visual Acuity Right Eye Distance:   Left Eye Distance:   Bilateral Distance:    Right Eye Near:   Left Eye Near:    Bilateral Near:     Physical Exam Vitals and nursing note reviewed.  Constitutional:      General: She is not in acute distress.    Appearance: Normal appearance. She is not toxic-appearing.  HENT:     Head: Normocephalic and atraumatic.  Pulmonary:     Effort: Pulmonary effort is normal. No respiratory distress.  Skin:    General: Skin is warm and dry.     Capillary Refill: Capillary refill takes less than 2 seconds.     Findings: Erythema and rash present. Rash is papular, urticarial and vesicular.          Comments: Slightly erythematous, vesicular rash with linear lesions to the right wrist, left arm, and right low back.  There is a little bit of clear drainage/oozing.  No surrounding erythema, fluctuance, warmth, or tenderness to palpation.  Neurological:     Mental Status: She is alert and oriented to person, place, and time.  Psychiatric:        Behavior: Behavior is cooperative.      UC Treatments / Results  Labs (all labs ordered are listed, but only abnormal results are displayed) Labs Reviewed - No data to display  EKG   Radiology No results found.  Procedures Procedures (including critical care time)  Medications Ordered in UC Medications - No data to display  Initial Impression / Assessment and Plan / UC Course  I have reviewed the triage vital signs and the nursing notes.  Pertinent labs & imaging results that were available during my care of the patient were reviewed by me and considered in my medical decision making (see chart for details).    Patient is well-appearing, afebrile, not tachycardic, not tachypneic, oxygenating well on room air.  She is slightly hypertensive today, likely secondary to rash and  also has missed her morning blood pressure medication dose.  Symptoms and exam consistent with irritant contact dermatitis due to likely poison ivy.  Given history of diabetes, will treat with topical corticosteroid ointment twice daily.  Defer prednisone at this time per patient request and shared decision making.  ER and return precautions discussed.  The patient was given the opportunity to ask questions.  All questions answered to their satisfaction.  The patient is in agreement to this plan.    Final Clinical Impressions(s) / UC Diagnoses   Final diagnoses:  Irritant contact dermatitis due to plants, except food     Discharge Instructions      The rash is consistent with poison ivy.  Please start using the steroid ointment twice daily to help with the itching.  Make sure you wash all of the clothing that you are skin came into contact with on the day you did the planting last week.  Follow-up with [place] or primary care provider with no improvement or worsening symptoms.     ED Prescriptions     Medication Sig Dispense Auth. Provider   triamcinolone ointment (KENALOG) 0.1 % Apply 1 Application topically 2 (two) times daily. Apply sparingly to affected areas twice daily as needed for itching. 30 g Eulogio Bear, NP      PDMP not reviewed this encounter.   Eulogio Bear, NP 10/20/21 [phone]

## 2021-10-20 NOTE — ED Triage Notes (Signed)
Pt presents with rash on arms and back that began on Thursday.

## 2021-10-20 NOTE — Discharge Instructions (Signed)
The rash is consistent with poison ivy.  Please start using the steroid ointment twice daily to help with the itching.  Make sure you wash all of the clothing that you are skin came into contact with on the day you did the planting last week.  Follow-up with [place] or primary care provider with no improvement or worsening symptoms.

## 2021-10-24 DIAGNOSIS — Z23 Encounter for immunization: Secondary | ICD-10-CM | POA: Diagnosis not present

## 2021-10-24 DIAGNOSIS — L237 Allergic contact dermatitis due to plants, except food: Secondary | ICD-10-CM | POA: Diagnosis not present

## 2021-10-24 DIAGNOSIS — I10 Essential (primary) hypertension: Secondary | ICD-10-CM | POA: Diagnosis not present

## 2021-11-03 ENCOUNTER — Ambulatory Visit: Payer: Medicare Other | Admitting: Urology

## 2021-11-03 ENCOUNTER — Encounter: Payer: Self-pay | Admitting: Urology

## 2021-11-03 VITALS — BP 159/79 | HR 76 | Ht 65.0 in | Wt 129.0 lb

## 2021-11-03 DIAGNOSIS — Z87440 Personal history of urinary (tract) infections: Secondary | ICD-10-CM | POA: Diagnosis not present

## 2021-11-03 DIAGNOSIS — N1330 Unspecified hydronephrosis: Secondary | ICD-10-CM | POA: Diagnosis not present

## 2021-11-03 LAB — URINALYSIS, ROUTINE W REFLEX MICROSCOPIC
Bilirubin, UA: NEGATIVE
Glucose, UA: NEGATIVE
Ketones, UA: NEGATIVE
Nitrite, UA: NEGATIVE
Protein,UA: NEGATIVE
RBC, UA: NEGATIVE
Specific Gravity, UA: <1.005 — ABNORMAL LOW (ref 1.005–1.030)
Urobilinogen, Ur: 0.2 mg/dL (ref 0.2–1.0)
pH, UA: 5.5 (ref 5.0–7.5)

## 2021-11-03 LAB — MICROSCOPIC EXAMINATION: RBC, Urine: NONE SEEN /hpf (ref 0–2)

## 2021-11-03 NOTE — Progress Notes (Signed)
Assessment: 1. Hydronephrosis of right kidney; due to chronic low grade UPJ obstruction   2. History of UTI     Plan: I personally reviewed the CT study from 10/08/2021 with results as noted below. Results discussed with the patient today. Her right hydronephrosis appears to be secondary to a low-grade congenital UPJ obstruction and appears to be stable in comparison to a study from 2008. I would not recommend any intervention at this time. Continue methods to reduce the risk of UTI's discussed including increased fluid intake, timed and double voiding, daily cranberry supplement, and daily probiotics.  I also discussed the potential benefit of vaginal hormone cream. Recommend follow-up with Dr. Zigmund Daniel if patient is interested in additional treatment for her vaginal wall prolapse. Return to office prn  Chief Complaint:  Chief Complaint  Patient presents with   Hydronephrosis    History of Present Illness:  Amy Rojas is a 81 y.o. female who is seen for further evaluation of right hydronephrosis.  She was recently found to have a slight elevation in her creatinine to 1.23. Renal ultrasound from 09/19/2021 showed moderate hydronephrosis of the right kidney increased with a voiding, postvoid residual of 90 mL. No flank pain.  No recent dysuria or gross hematuria.  No history of kidney stones. She has a history of frequent UTIs going back to childhood.  Typical symptoms include frequency, urgency, and dysuria.  No fevers or chills.  Her last UTI symptoms were in May 2023.  She was treated with Macrodantin.  No urine culture results available.  She was previously seen by Dr. [place] Pink in 2018 for evaluation of vaginal wall prolapse.  She was diagnosed with uterine prolapse and a cystocele.  Options for management including surgical management with sacrospinous hysteropexy, A&P repair, and possible sling discussed.  She elected for observation.  She has noted some worsening  of the vaginal prolapse.  She was evaluated with CT abdomen and pelvis without contrast on 10/08/2021.  This study showed mild to moderate right renal pelvicaliectasis without significant change in comparison to a study from 2008, no ureteral dilation, 1.9 cm cystic area in the distal urethra, which may represent a urethral diverticulum or Skene's gland cyst.  She returns today for scheduled follow-up and to discuss the CT results.  No change in her symptoms.  No flank pain.  No dysuria or gross hematuria.  Portions of the above documentation were copied from a prior visit for review purposes only.   Past Medical History:  Past Medical History:  Diagnosis Date   Borderline diabetic    Diverticulitis    Diverticulosis    High cholesterol     Past Surgical History:  Past Surgical History:  Procedure Laterality Date   cataracts     CHOLECYSTECTOMY     COLONOSCOPY N/A 12/09/2017   Procedure: COLONOSCOPY;  Surgeon: Rogene Houston, MD;  Location: [facility];  Service: Endoscopy;  Laterality: N/A;  1200   EYE SURGERY     High cholesterol     lazy eye surgery     x 2 over the years    Allergies:  Allergies  Allergen Reactions   Sulfa Antibiotics Other (See Comments)    Cystitis as a child    Family History:  Family History  Problem Relation Age of Onset   Arthritis Other    Diabetes Other    Kidney disease Other    Colon cancer Maternal Uncle    Heart failure Mother  Rheum arthritis Father     Social History:  Social History   Tobacco Use   Smoking status: Never   Smokeless tobacco: Never  Vaping Use   Vaping Use: Never used  Substance Use Topics   Alcohol use: No   Drug use: No    ROS: Constitutional:  Negative for fever, chills, weight loss CV: Negative for chest pain, previous MI, hypertension Respiratory:  Negative for shortness of breath, wheezing, sleep apnea, frequent cough GI:  Negative for nausea, vomiting, bloody stool, GERD  Physical  exam: BP (!) 159/79   Pulse 76   Ht '5\' 5"'$  (1.651 m)   Wt 129 lb (58.5 kg)   BMI 21.47 kg/m  GENERAL APPEARANCE:  Well appearing, well developed, well nourished, NAD HEENT:  Atraumatic, normocephalic, oropharynx clear NECK:  Supple without lymphadenopathy or thyromegaly ABDOMEN:  Soft, non-tender, no masses EXTREMITIES:  Moves all extremities well, without clubbing, cyanosis, or edema NEUROLOGIC:  Alert and oriented x 3, normal gait, CN II-XII grossly intact MENTAL STATUS:  appropriate BACK:  Non-tender to palpation, No CVAT SKIN:  Warm, dry, and intact  Results: U/A: 0-5 WBC, 0 RBC, few bacteria

## 2021-11-03 NOTE — Addendum Note (Signed)
Addended by: Audie Box on: 11/03/2021 02:43 PM   Modules accepted: Orders

## 2021-11-07 DIAGNOSIS — E119 Type 2 diabetes mellitus without complications: Secondary | ICD-10-CM | POA: Diagnosis not present

## 2021-11-12 DIAGNOSIS — K5792 Diverticulitis of intestine, part unspecified, without perforation or abscess without bleeding: Secondary | ICD-10-CM | POA: Diagnosis not present

## 2021-11-21 IMAGING — MG DIGITAL SCREENING BILAT W/ TOMO W/ CAD
8 series · 9 of 24 positions shown · non-contrast
Comparison: Previous exam(s).

CLINICAL DATA: Screening.

EXAM:
DIGITAL SCREENING BILATERAL MAMMOGRAM WITH TOMO AND CAD

[R MLO synth-2D]
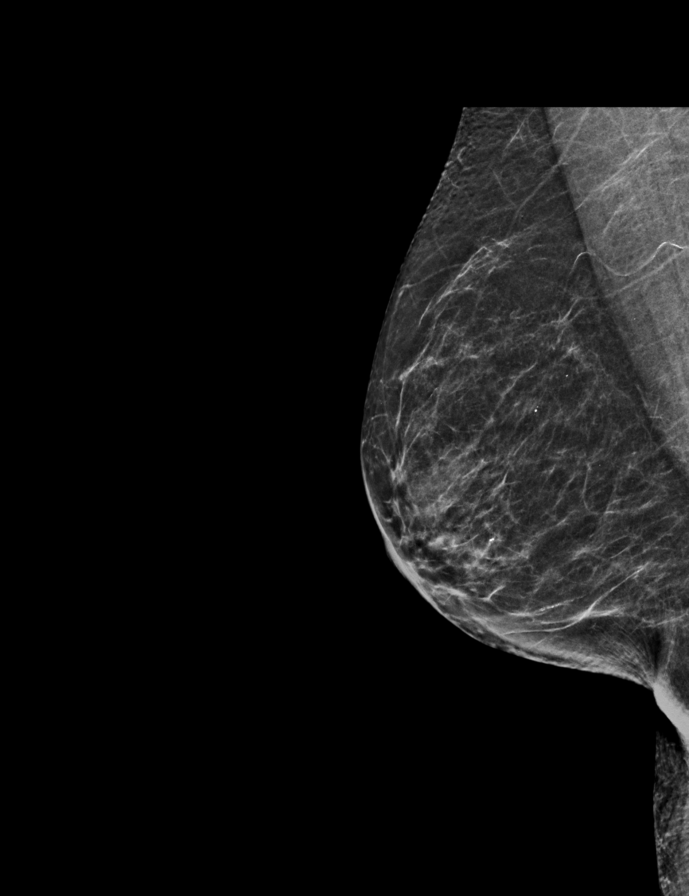

[R CC synth-2D]
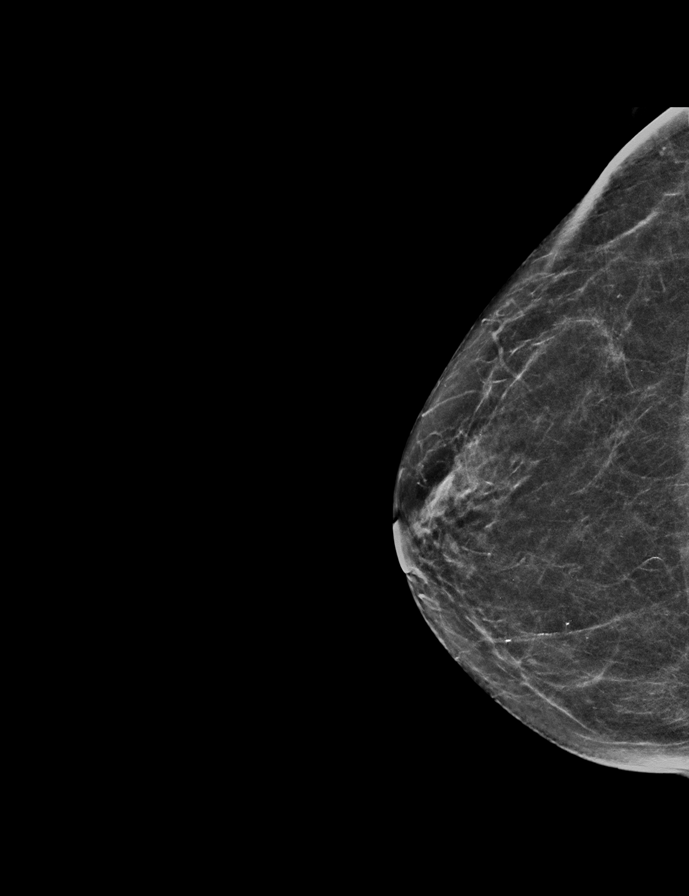

[L MLO synth-2D]
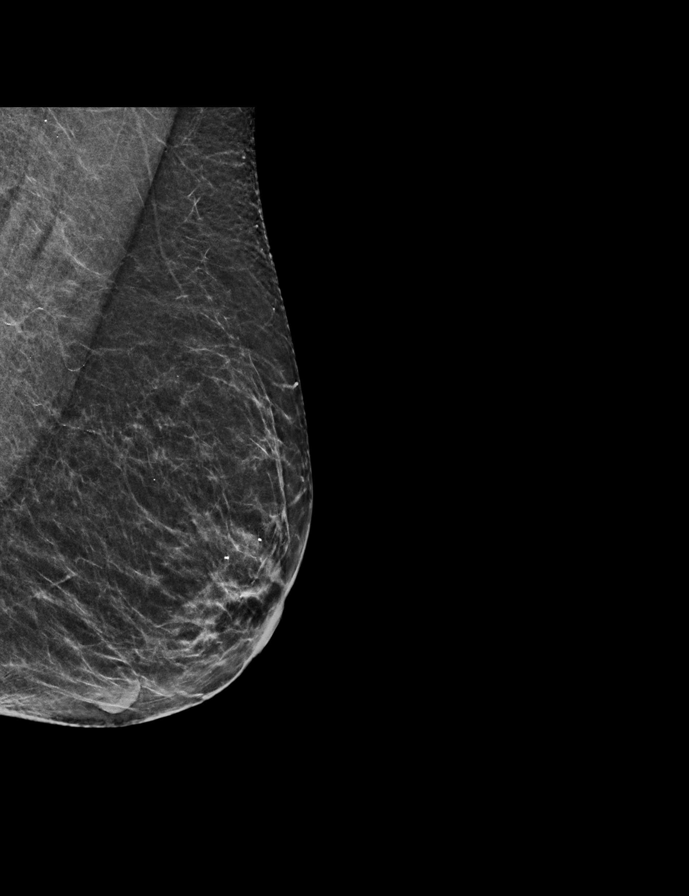

[L CC synth-2D]
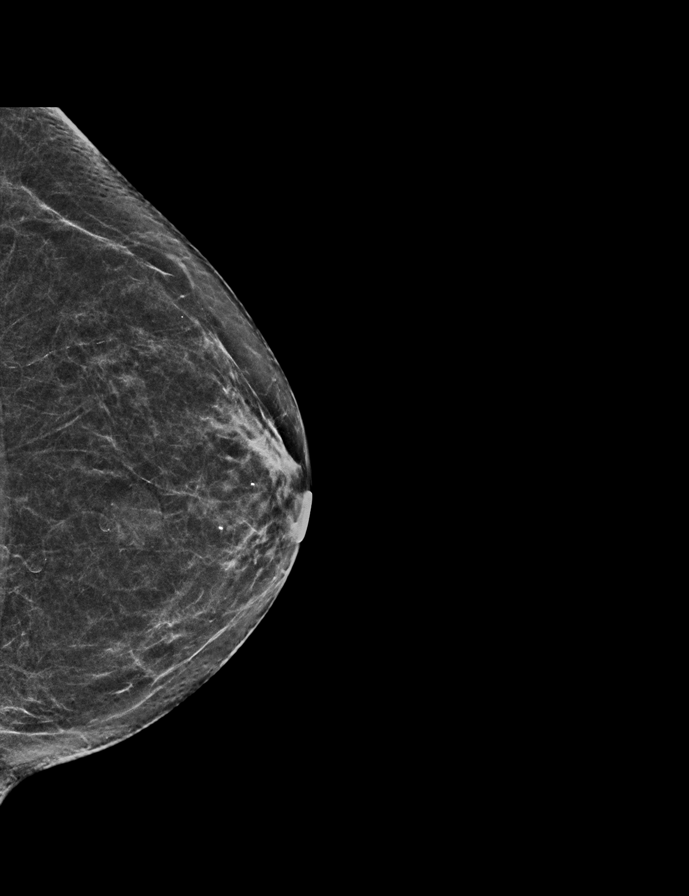

[R MLO tomo · 2 of 53 frames shown]
[frame 18/53]
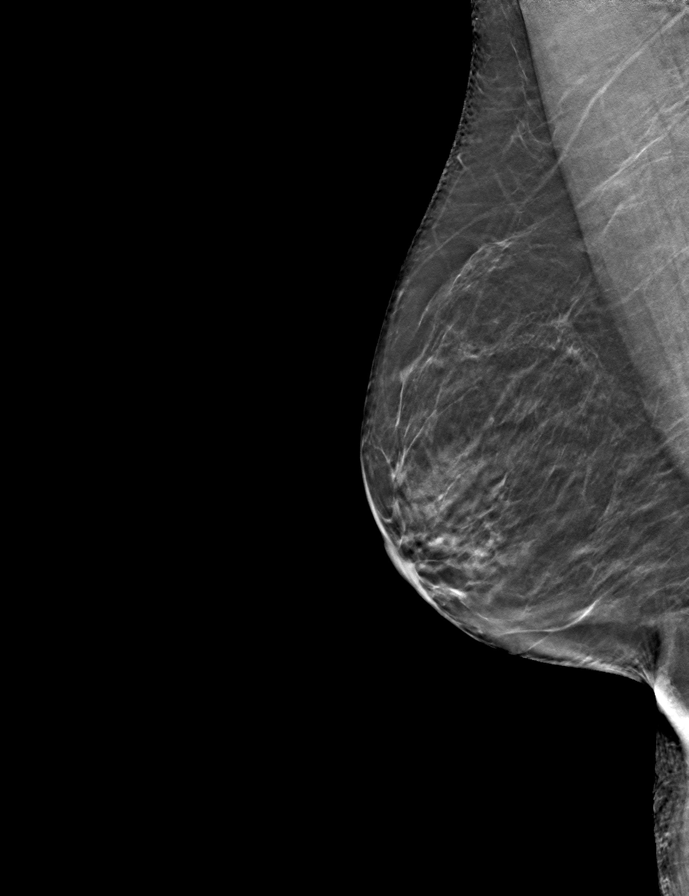
[frame 27/53]
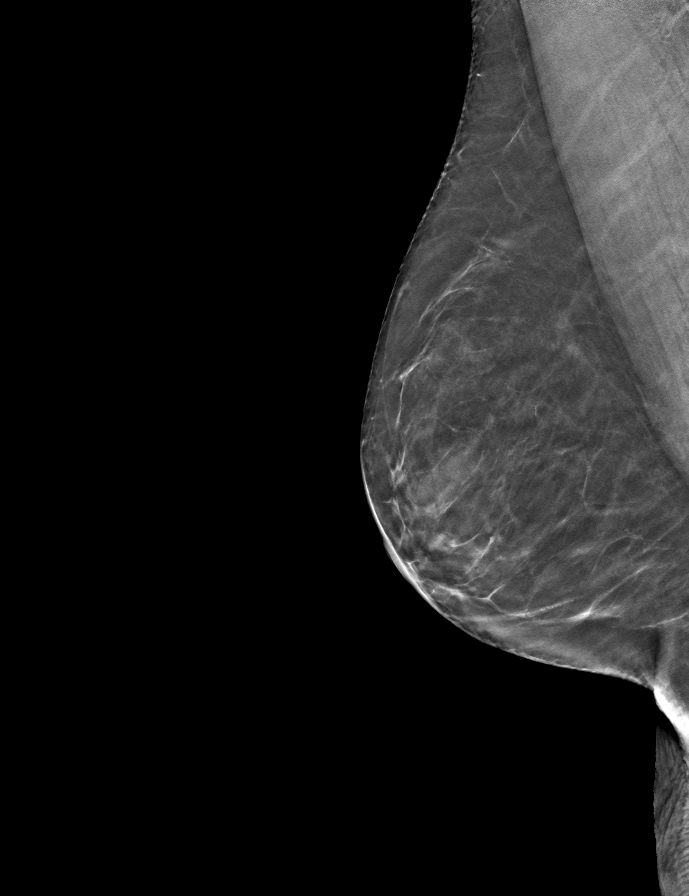

[R CC tomo · tomo slice 29/56.0]
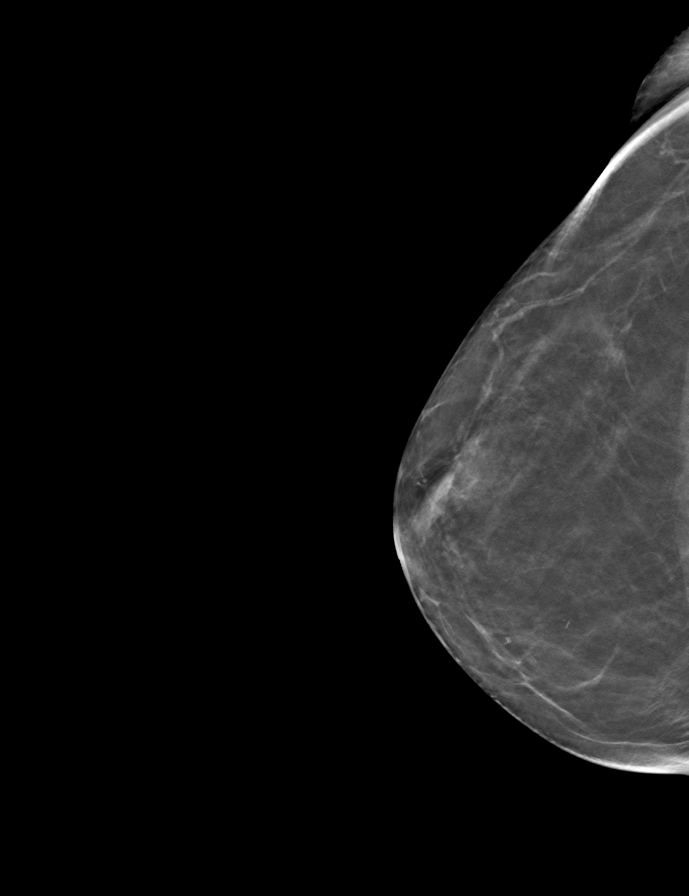

[L CC tomo · tomo slice 25/50.0]
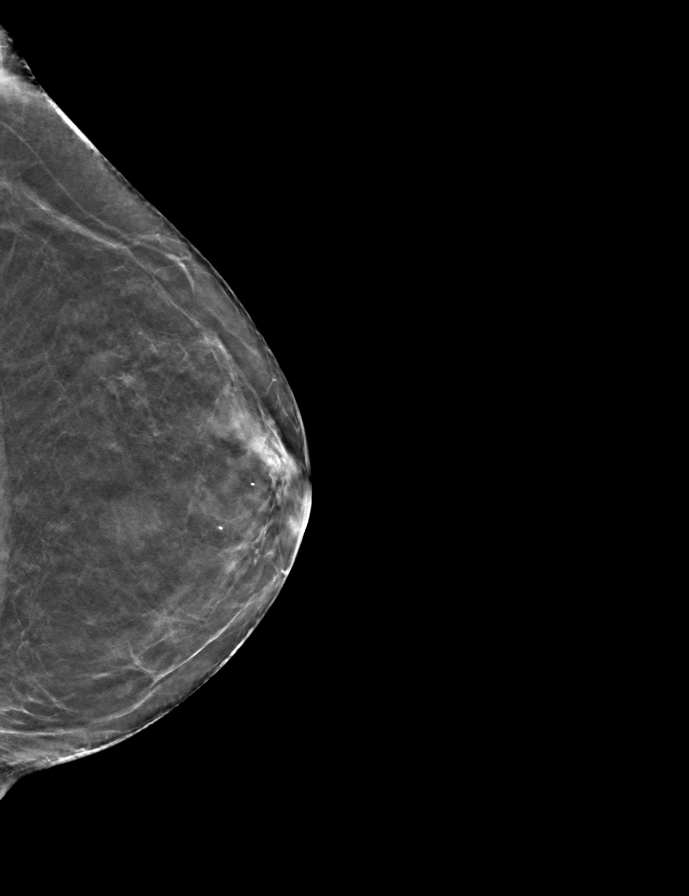

[L MLO tomo · tomo slice 26/51.0]
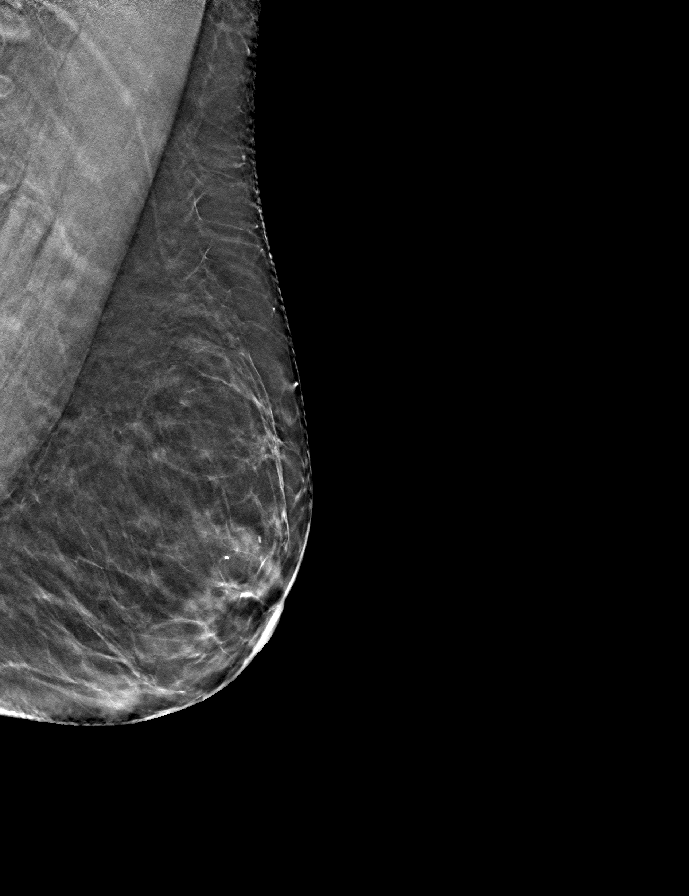

[9 of 24 positions shown; findings below may reference images not displayed]

ACR Breast Density Category b: There are scattered areas of
fibroglandular density.
FINDINGS: There are no findings suspicious for malignancy. Images were
processed with CAD.
IMPRESSION: No mammographic evidence of malignancy. A result letter of this
screening mammogram will be mailed directly to the patient.

RECOMMENDATION:
Screening mammogram in one year. (Code:[id number])

BI-RADS CATEGORY  1: Negative.

## 2021-12-19 DIAGNOSIS — E1129 Type 2 diabetes mellitus with other diabetic kidney complication: Secondary | ICD-10-CM | POA: Diagnosis not present

## 2021-12-19 DIAGNOSIS — Z79899 Other long term (current) drug therapy: Secondary | ICD-10-CM | POA: Diagnosis not present

## 2021-12-19 DIAGNOSIS — I10 Essential (primary) hypertension: Secondary | ICD-10-CM | POA: Diagnosis not present

## 2021-12-19 DIAGNOSIS — N1831 Chronic kidney disease, stage 3a: Secondary | ICD-10-CM | POA: Diagnosis not present

## 2021-12-26 DIAGNOSIS — N1831 Chronic kidney disease, stage 3a: Secondary | ICD-10-CM | POA: Diagnosis not present

## 2021-12-26 DIAGNOSIS — E785 Hyperlipidemia, unspecified: Secondary | ICD-10-CM | POA: Diagnosis not present

## 2021-12-26 DIAGNOSIS — E1122 Type 2 diabetes mellitus with diabetic chronic kidney disease: Secondary | ICD-10-CM | POA: Diagnosis not present

## 2021-12-26 DIAGNOSIS — R7309 Other abnormal glucose: Secondary | ICD-10-CM | POA: Diagnosis not present

## 2022-03-12 DIAGNOSIS — K080 Exfoliation of teeth due to systemic causes: Secondary | ICD-10-CM | POA: Diagnosis not present

## 2022-03-19 DIAGNOSIS — H401131 Primary open-angle glaucoma, bilateral, mild stage: Secondary | ICD-10-CM | POA: Diagnosis not present

## 2022-03-19 DIAGNOSIS — Z961 Presence of intraocular lens: Secondary | ICD-10-CM | POA: Diagnosis not present

## 2022-03-19 DIAGNOSIS — H53002 Unspecified amblyopia, left eye: Secondary | ICD-10-CM | POA: Diagnosis not present

## 2022-03-19 DIAGNOSIS — E119 Type 2 diabetes mellitus without complications: Secondary | ICD-10-CM | POA: Diagnosis not present

## 2022-04-23 DIAGNOSIS — N1831 Chronic kidney disease, stage 3a: Secondary | ICD-10-CM | POA: Diagnosis not present

## 2022-04-23 DIAGNOSIS — E1129 Type 2 diabetes mellitus with other diabetic kidney complication: Secondary | ICD-10-CM | POA: Diagnosis not present

## 2022-04-23 DIAGNOSIS — I10 Essential (primary) hypertension: Secondary | ICD-10-CM | POA: Diagnosis not present

## 2022-04-27 DIAGNOSIS — N1831 Chronic kidney disease, stage 3a: Secondary | ICD-10-CM | POA: Diagnosis not present

## 2022-04-27 DIAGNOSIS — E1122 Type 2 diabetes mellitus with diabetic chronic kidney disease: Secondary | ICD-10-CM | POA: Diagnosis not present

## 2022-04-27 DIAGNOSIS — R7309 Other abnormal glucose: Secondary | ICD-10-CM | POA: Diagnosis not present

## 2022-04-27 DIAGNOSIS — I10 Essential (primary) hypertension: Secondary | ICD-10-CM | POA: Diagnosis not present

## 2022-05-28 DIAGNOSIS — K080 Exfoliation of teeth due to systemic causes: Secondary | ICD-10-CM | POA: Diagnosis not present

## 2022-06-18 DIAGNOSIS — L259 Unspecified contact dermatitis, unspecified cause: Secondary | ICD-10-CM | POA: Diagnosis not present

## 2022-07-28 DIAGNOSIS — K080 Exfoliation of teeth due to systemic causes: Secondary | ICD-10-CM | POA: Diagnosis not present

## 2022-08-04 DIAGNOSIS — J069 Acute upper respiratory infection, unspecified: Secondary | ICD-10-CM | POA: Diagnosis not present

## 2022-08-20 DIAGNOSIS — Z79899 Other long term (current) drug therapy: Secondary | ICD-10-CM | POA: Diagnosis not present

## 2022-08-20 DIAGNOSIS — E1129 Type 2 diabetes mellitus with other diabetic kidney complication: Secondary | ICD-10-CM | POA: Diagnosis not present

## 2022-08-20 DIAGNOSIS — N1831 Chronic kidney disease, stage 3a: Secondary | ICD-10-CM | POA: Diagnosis not present

## 2022-08-20 DIAGNOSIS — I10 Essential (primary) hypertension: Secondary | ICD-10-CM | POA: Diagnosis not present

## 2022-08-27 DIAGNOSIS — N1832 Chronic kidney disease, stage 3b: Secondary | ICD-10-CM | POA: Diagnosis not present

## 2022-08-27 DIAGNOSIS — I10 Essential (primary) hypertension: Secondary | ICD-10-CM | POA: Diagnosis not present

## 2022-08-27 DIAGNOSIS — E1122 Type 2 diabetes mellitus with diabetic chronic kidney disease: Secondary | ICD-10-CM | POA: Diagnosis not present

## 2022-08-31 DIAGNOSIS — K080 Exfoliation of teeth due to systemic causes: Secondary | ICD-10-CM | POA: Diagnosis not present

## 2022-09-17 DIAGNOSIS — H401131 Primary open-angle glaucoma, bilateral, mild stage: Secondary | ICD-10-CM | POA: Diagnosis not present

## 2022-09-25 DIAGNOSIS — Z961 Presence of intraocular lens: Secondary | ICD-10-CM | POA: Diagnosis not present

## 2022-09-25 DIAGNOSIS — H53002 Unspecified amblyopia, left eye: Secondary | ICD-10-CM | POA: Diagnosis not present

## 2022-09-25 DIAGNOSIS — H401131 Primary open-angle glaucoma, bilateral, mild stage: Secondary | ICD-10-CM | POA: Diagnosis not present

## 2022-10-05 DIAGNOSIS — K080 Exfoliation of teeth due to systemic causes: Secondary | ICD-10-CM | POA: Diagnosis not present

## 2022-12-23 DIAGNOSIS — N1832 Chronic kidney disease, stage 3b: Secondary | ICD-10-CM | POA: Diagnosis not present

## 2022-12-23 DIAGNOSIS — I10 Essential (primary) hypertension: Secondary | ICD-10-CM | POA: Diagnosis not present

## 2022-12-23 DIAGNOSIS — Z79899 Other long term (current) drug therapy: Secondary | ICD-10-CM | POA: Diagnosis not present

## 2022-12-23 DIAGNOSIS — E1129 Type 2 diabetes mellitus with other diabetic kidney complication: Secondary | ICD-10-CM | POA: Diagnosis not present

## 2022-12-28 DIAGNOSIS — N1832 Chronic kidney disease, stage 3b: Secondary | ICD-10-CM | POA: Diagnosis not present

## 2022-12-28 DIAGNOSIS — E785 Hyperlipidemia, unspecified: Secondary | ICD-10-CM | POA: Diagnosis not present

## 2022-12-28 DIAGNOSIS — E1122 Type 2 diabetes mellitus with diabetic chronic kidney disease: Secondary | ICD-10-CM | POA: Diagnosis not present

## 2022-12-28 DIAGNOSIS — Z23 Encounter for immunization: Secondary | ICD-10-CM | POA: Diagnosis not present

## 2023-03-22 DIAGNOSIS — M26601 Right temporomandibular joint disorder, unspecified: Secondary | ICD-10-CM | POA: Diagnosis not present

## 2023-04-09 DIAGNOSIS — H53002 Unspecified amblyopia, left eye: Secondary | ICD-10-CM | POA: Diagnosis not present

## 2023-04-09 DIAGNOSIS — Z961 Presence of intraocular lens: Secondary | ICD-10-CM | POA: Diagnosis not present

## 2023-04-09 DIAGNOSIS — H43813 Vitreous degeneration, bilateral: Secondary | ICD-10-CM | POA: Diagnosis not present

## 2023-04-09 DIAGNOSIS — H401131 Primary open-angle glaucoma, bilateral, mild stage: Secondary | ICD-10-CM | POA: Diagnosis not present

## 2023-04-21 DIAGNOSIS — N1832 Chronic kidney disease, stage 3b: Secondary | ICD-10-CM | POA: Diagnosis not present

## 2023-04-21 DIAGNOSIS — Z79899 Other long term (current) drug therapy: Secondary | ICD-10-CM | POA: Diagnosis not present

## 2023-04-21 DIAGNOSIS — E785 Hyperlipidemia, unspecified: Secondary | ICD-10-CM | POA: Diagnosis not present

## 2023-04-28 DIAGNOSIS — N183 Chronic kidney disease, stage 3 unspecified: Secondary | ICD-10-CM | POA: Diagnosis not present

## 2023-04-28 DIAGNOSIS — I10 Essential (primary) hypertension: Secondary | ICD-10-CM | POA: Diagnosis not present

## 2023-04-28 DIAGNOSIS — E1129 Type 2 diabetes mellitus with other diabetic kidney complication: Secondary | ICD-10-CM | POA: Diagnosis not present

## 2023-04-29 DIAGNOSIS — K080 Exfoliation of teeth due to systemic causes: Secondary | ICD-10-CM | POA: Diagnosis not present

## 2023-05-10 DIAGNOSIS — D492 Neoplasm of unspecified behavior of bone, soft tissue, and skin: Secondary | ICD-10-CM | POA: Diagnosis not present

## 2023-05-10 DIAGNOSIS — L814 Other melanin hyperpigmentation: Secondary | ICD-10-CM | POA: Diagnosis not present

## 2023-05-10 DIAGNOSIS — L570 Actinic keratosis: Secondary | ICD-10-CM | POA: Diagnosis not present

## 2023-05-10 DIAGNOSIS — L439 Lichen planus, unspecified: Secondary | ICD-10-CM | POA: Diagnosis not present

## 2023-08-26 DIAGNOSIS — N1832 Chronic kidney disease, stage 3b: Secondary | ICD-10-CM | POA: Diagnosis not present

## 2023-08-26 DIAGNOSIS — E1129 Type 2 diabetes mellitus with other diabetic kidney complication: Secondary | ICD-10-CM | POA: Diagnosis not present

## 2023-08-26 DIAGNOSIS — E785 Hyperlipidemia, unspecified: Secondary | ICD-10-CM | POA: Diagnosis not present

## 2023-08-26 DIAGNOSIS — I10 Essential (primary) hypertension: Secondary | ICD-10-CM | POA: Diagnosis not present

## 2023-08-26 DIAGNOSIS — Z79899 Other long term (current) drug therapy: Secondary | ICD-10-CM | POA: Diagnosis not present

## 2023-09-01 DIAGNOSIS — I10 Essential (primary) hypertension: Secondary | ICD-10-CM | POA: Diagnosis not present

## 2023-09-01 DIAGNOSIS — E1122 Type 2 diabetes mellitus with diabetic chronic kidney disease: Secondary | ICD-10-CM | POA: Diagnosis not present

## 2023-09-01 DIAGNOSIS — N1832 Chronic kidney disease, stage 3b: Secondary | ICD-10-CM | POA: Diagnosis not present

## 2023-09-17 DIAGNOSIS — H401131 Primary open-angle glaucoma, bilateral, mild stage: Secondary | ICD-10-CM | POA: Diagnosis not present

## 2023-09-24 DIAGNOSIS — H53002 Unspecified amblyopia, left eye: Secondary | ICD-10-CM | POA: Diagnosis not present

## 2023-09-24 DIAGNOSIS — H401131 Primary open-angle glaucoma, bilateral, mild stage: Secondary | ICD-10-CM | POA: Diagnosis not present

## 2023-10-19 DIAGNOSIS — L814 Other melanin hyperpigmentation: Secondary | ICD-10-CM | POA: Diagnosis not present

## 2023-10-19 DIAGNOSIS — L570 Actinic keratosis: Secondary | ICD-10-CM | POA: Diagnosis not present

## 2023-10-19 DIAGNOSIS — D1801 Hemangioma of skin and subcutaneous tissue: Secondary | ICD-10-CM | POA: Diagnosis not present

## 2023-10-19 DIAGNOSIS — L821 Other seborrheic keratosis: Secondary | ICD-10-CM | POA: Diagnosis not present

## 2023-10-28 ENCOUNTER — Encounter: Payer: Self-pay | Admitting: *Deleted

## 2023-10-28 NOTE — Progress Notes (Signed)
 JYLL TOMARO                                          MRN: [id number]   10/28/2023   The [facility] Quality Team Specialist reviewed this patient medical record for the purposes of chart review for care gap closure. The following were reviewed: chart review for care gap closure-kidney health evaluation for diabetes:eGFR  and uACR.    [facility] Quality Team

## 2023-11-15 DIAGNOSIS — K080 Exfoliation of teeth due to systemic causes: Secondary | ICD-10-CM | POA: Diagnosis not present

## 2023-11-22 DIAGNOSIS — H9211 Otorrhea, right ear: Secondary | ICD-10-CM | POA: Diagnosis not present

## 2023-11-22 DIAGNOSIS — Z23 Encounter for immunization: Secondary | ICD-10-CM | POA: Diagnosis not present

## 2023-12-31 DIAGNOSIS — I10 Essential (primary) hypertension: Secondary | ICD-10-CM | POA: Diagnosis not present

## 2023-12-31 DIAGNOSIS — Z79899 Other long term (current) drug therapy: Secondary | ICD-10-CM | POA: Diagnosis not present

## 2023-12-31 DIAGNOSIS — E1129 Type 2 diabetes mellitus with other diabetic kidney complication: Secondary | ICD-10-CM | POA: Diagnosis not present

## 2023-12-31 DIAGNOSIS — N1832 Chronic kidney disease, stage 3b: Secondary | ICD-10-CM | POA: Diagnosis not present

## 2024-01-05 DIAGNOSIS — I10 Essential (primary) hypertension: Secondary | ICD-10-CM | POA: Diagnosis not present

## 2024-01-05 DIAGNOSIS — E1122 Type 2 diabetes mellitus with diabetic chronic kidney disease: Secondary | ICD-10-CM | POA: Diagnosis not present

## 2024-01-05 DIAGNOSIS — E785 Hyperlipidemia, unspecified: Secondary | ICD-10-CM | POA: Diagnosis not present

## 2024-01-05 DIAGNOSIS — N1832 Chronic kidney disease, stage 3b: Secondary | ICD-10-CM | POA: Diagnosis not present
